# Patient Record
Sex: Female | Born: 1980 | Race: Black or African American | Hispanic: No | Marital: Single | Smoking: Former smoker
Health system: Southern US, Community
[De-identification: ages and names within clinical notes are randomized; demographics above are authoritative.]

## PROBLEM LIST (undated history)

## (undated) DIAGNOSIS — IMO0002 Reserved for concepts with insufficient information to code with codable children: Secondary | ICD-10-CM

## (undated) DIAGNOSIS — N946 Dysmenorrhea, unspecified: Secondary | ICD-10-CM

## (undated) DIAGNOSIS — J45909 Unspecified asthma, uncomplicated: Secondary | ICD-10-CM

## (undated) DIAGNOSIS — N920 Excessive and frequent menstruation with regular cycle: Secondary | ICD-10-CM

## (undated) DIAGNOSIS — I10 Essential (primary) hypertension: Secondary | ICD-10-CM

## (undated) DIAGNOSIS — D649 Anemia, unspecified: Secondary | ICD-10-CM

## (undated) DIAGNOSIS — K219 Gastro-esophageal reflux disease without esophagitis: Secondary | ICD-10-CM

## (undated) DIAGNOSIS — D219 Benign neoplasm of connective and other soft tissue, unspecified: Secondary | ICD-10-CM

## (undated) DIAGNOSIS — R87619 Unspecified abnormal cytological findings in specimens from cervix uteri: Secondary | ICD-10-CM

## (undated) DIAGNOSIS — R011 Cardiac murmur, unspecified: Secondary | ICD-10-CM

## (undated) DIAGNOSIS — B009 Herpesviral infection, unspecified: Secondary | ICD-10-CM

## (undated) DIAGNOSIS — F419 Anxiety disorder, unspecified: Secondary | ICD-10-CM

## (undated) HISTORY — DX: Excessive and frequent menstruation with regular cycle: N92.0

## (undated) HISTORY — DX: Unspecified abnormal cytological findings in specimens from cervix uteri: R87.619

## (undated) HISTORY — DX: Herpesviral infection, unspecified: B00.9

## (undated) HISTORY — DX: Unspecified asthma, uncomplicated: J45.909

## (undated) HISTORY — DX: Dysmenorrhea, unspecified: N94.6

## (undated) HISTORY — PX: NO PAST SURGERIES: SHX2092

## (undated) HISTORY — DX: Benign neoplasm of connective and other soft tissue, unspecified: D21.9

## (undated) HISTORY — DX: Anemia, unspecified: D64.9

## (undated) HISTORY — DX: Reserved for concepts with insufficient information to code with codable children: IMO0002

## (undated) HISTORY — PX: MYOMECTOMY: SHX85

---

## 2002-05-27 ENCOUNTER — Encounter: Admission: RE | Admit: 2002-05-27 | Discharge: 2002-05-27 | Payer: Self-pay | Admitting: Internal Medicine

## 2003-09-06 ENCOUNTER — Inpatient Hospital Stay (HOSPITAL_COMMUNITY): Admission: AD | Admit: 2003-09-06 | Discharge: 2003-09-06 | Payer: Self-pay | Admitting: *Deleted

## 2004-05-01 ENCOUNTER — Inpatient Hospital Stay (HOSPITAL_COMMUNITY): Admission: AD | Admit: 2004-05-01 | Discharge: 2004-05-03 | Payer: Self-pay

## 2004-08-03 ENCOUNTER — Ambulatory Visit: Payer: Self-pay | Admitting: Nurse Practitioner

## 2004-08-26 ENCOUNTER — Ambulatory Visit: Payer: Self-pay | Admitting: Internal Medicine

## 2004-08-26 ENCOUNTER — Ambulatory Visit: Payer: Self-pay | Admitting: *Deleted

## 2004-09-06 ENCOUNTER — Ambulatory Visit: Payer: Self-pay | Admitting: Nurse Practitioner

## 2004-09-21 ENCOUNTER — Ambulatory Visit: Payer: Self-pay | Admitting: Nurse Practitioner

## 2005-01-11 ENCOUNTER — Ambulatory Visit: Payer: Self-pay | Admitting: Nurse Practitioner

## 2005-04-25 ENCOUNTER — Ambulatory Visit: Payer: Self-pay | Admitting: Nurse Practitioner

## 2005-06-16 ENCOUNTER — Ambulatory Visit: Payer: Self-pay | Admitting: Nurse Practitioner

## 2005-07-25 ENCOUNTER — Ambulatory Visit: Payer: Self-pay | Admitting: Nurse Practitioner

## 2005-08-16 ENCOUNTER — Ambulatory Visit: Payer: Self-pay | Admitting: Nurse Practitioner

## 2005-10-24 ENCOUNTER — Ambulatory Visit: Payer: Self-pay | Admitting: Nurse Practitioner

## 2006-01-19 ENCOUNTER — Ambulatory Visit: Payer: Self-pay | Admitting: Nurse Practitioner

## 2006-01-23 ENCOUNTER — Ambulatory Visit (HOSPITAL_COMMUNITY): Admission: RE | Admit: 2006-01-23 | Discharge: 2006-01-23 | Payer: Self-pay | Admitting: Internal Medicine

## 2006-01-29 ENCOUNTER — Ambulatory Visit: Payer: Self-pay | Admitting: Nurse Practitioner

## 2006-01-31 ENCOUNTER — Ambulatory Visit: Payer: Self-pay | Admitting: Nurse Practitioner

## 2006-05-18 ENCOUNTER — Ambulatory Visit: Payer: Self-pay | Admitting: Family Medicine

## 2006-06-11 ENCOUNTER — Ambulatory Visit: Payer: Self-pay | Admitting: Nurse Practitioner

## 2006-06-20 ENCOUNTER — Ambulatory Visit: Payer: Self-pay | Admitting: Nurse Practitioner

## 2006-08-08 ENCOUNTER — Ambulatory Visit: Payer: Self-pay | Admitting: Nurse Practitioner

## 2007-01-28 ENCOUNTER — Emergency Department (HOSPITAL_COMMUNITY): Admission: EM | Admit: 2007-01-28 | Discharge: 2007-01-28 | Payer: Self-pay | Admitting: Family Medicine

## 2007-02-01 ENCOUNTER — Emergency Department (HOSPITAL_COMMUNITY): Admission: EM | Admit: 2007-02-01 | Discharge: 2007-02-01 | Payer: Self-pay | Admitting: Family Medicine

## 2007-02-03 ENCOUNTER — Emergency Department (HOSPITAL_COMMUNITY): Admission: EM | Admit: 2007-02-03 | Discharge: 2007-02-03 | Payer: Self-pay | Admitting: Family Medicine

## 2007-02-25 DIAGNOSIS — J45909 Unspecified asthma, uncomplicated: Secondary | ICD-10-CM | POA: Insufficient documentation

## 2007-05-08 ENCOUNTER — Encounter (INDEPENDENT_AMBULATORY_CARE_PROVIDER_SITE_OTHER): Payer: Self-pay | Admitting: *Deleted

## 2008-05-26 ENCOUNTER — Encounter: Admission: RE | Admit: 2008-05-26 | Discharge: 2008-05-26 | Payer: Self-pay | Admitting: Internal Medicine

## 2008-11-22 ENCOUNTER — Emergency Department (HOSPITAL_COMMUNITY): Admission: EM | Admit: 2008-11-22 | Discharge: 2008-11-22 | Payer: Self-pay | Admitting: Emergency Medicine

## 2010-02-17 IMAGING — US US TRANSVAGINAL NON-OB
1 series · 13 of 25 positions shown · non-contrast
Comparison: [HOSPITAL] early pregnancy pelvic ultrasound
 09/06/2003 and [REDACTED] abdominal pelvic CT urogram 02/01/2007.

May 29, 2008 –DUPLICATE COPY for exam association in [facility]. No change from original report.
CLINICAL DATA: Follow-up left ovarian cyst. Pelvic pain.

 TRANSABDOMINAL AND TRANSVAGINAL ULTRASOUND OF PELVIS
TECHNIQUE: Both transabdominal and transvaginal ultrasound
 examinations of the pelvis were performed including evaluation of
 the uterus, ovaries, adnexal regions, and pelvic cul-de-sac.

[Series 1: us transvaginal non-ob · 0.24mm/px · 13 of 66 slices shown]
[im 1/66]
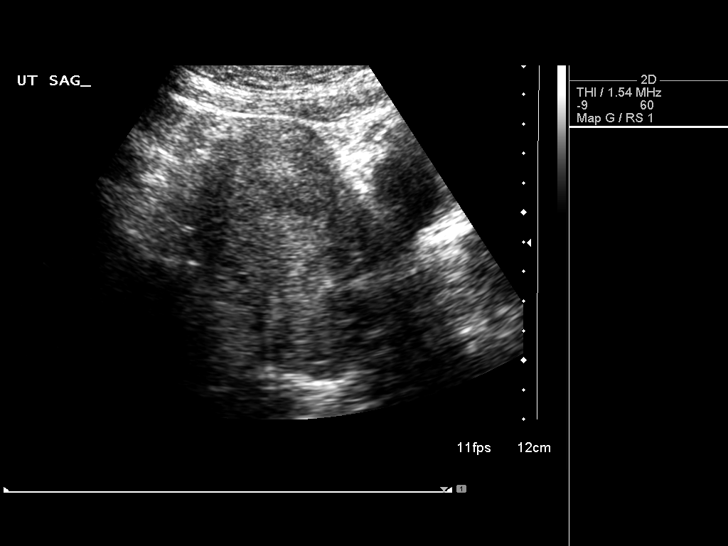
[im 6/66]
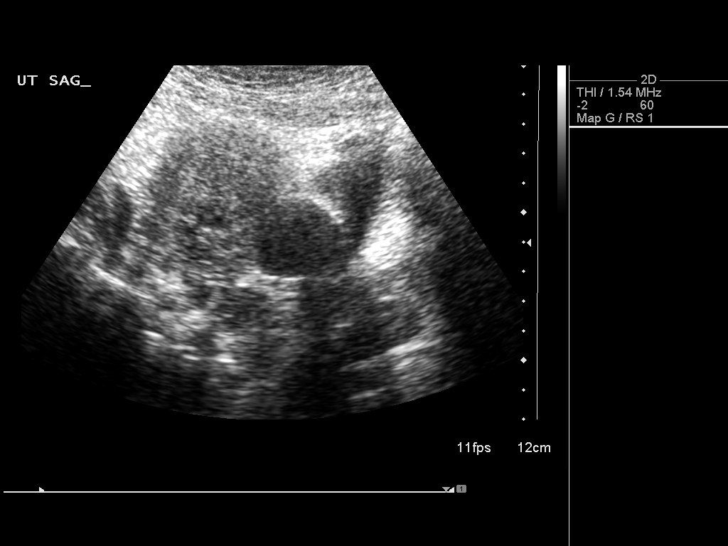
[im 11/66]
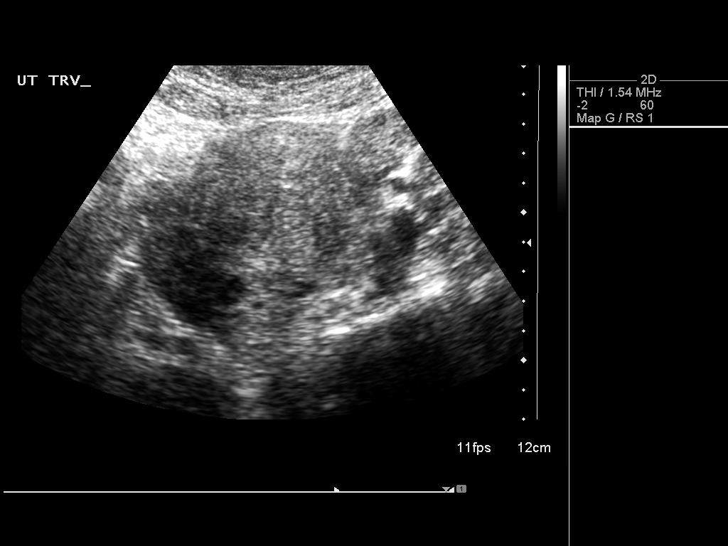
[im 17/66]
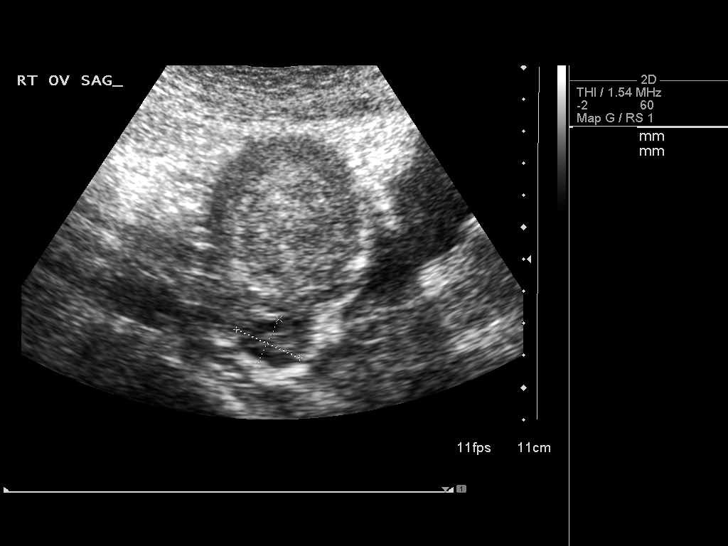
[im 22/66]
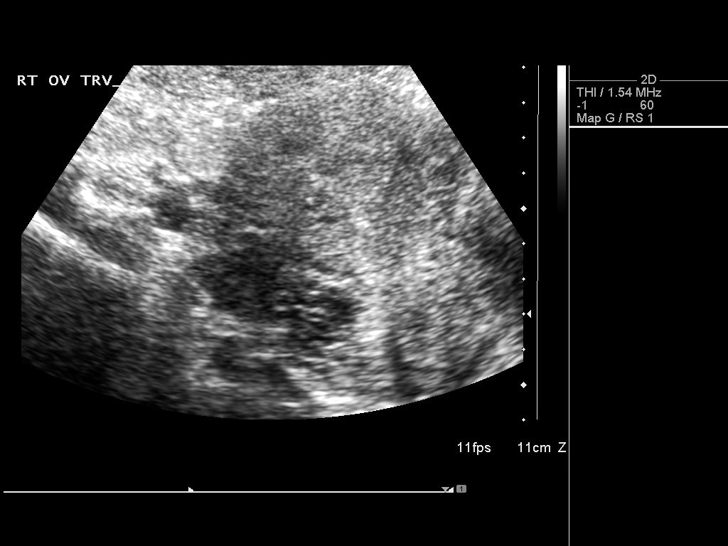
[im 28/66]
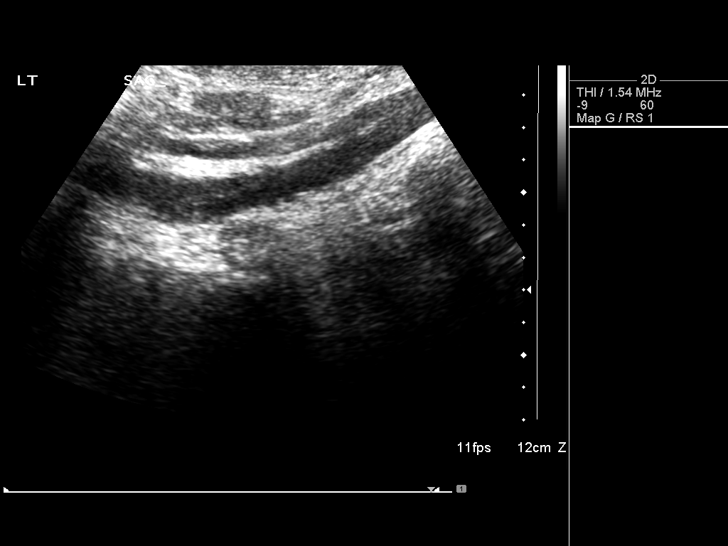
[im 33/66]
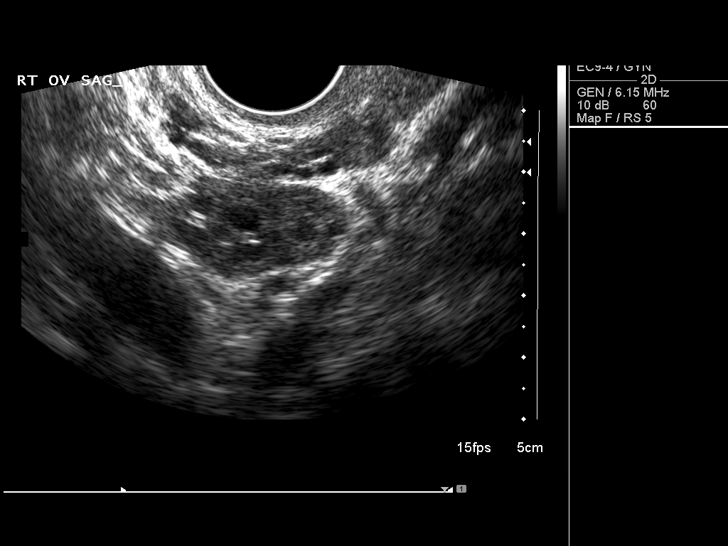
[im 38/66]
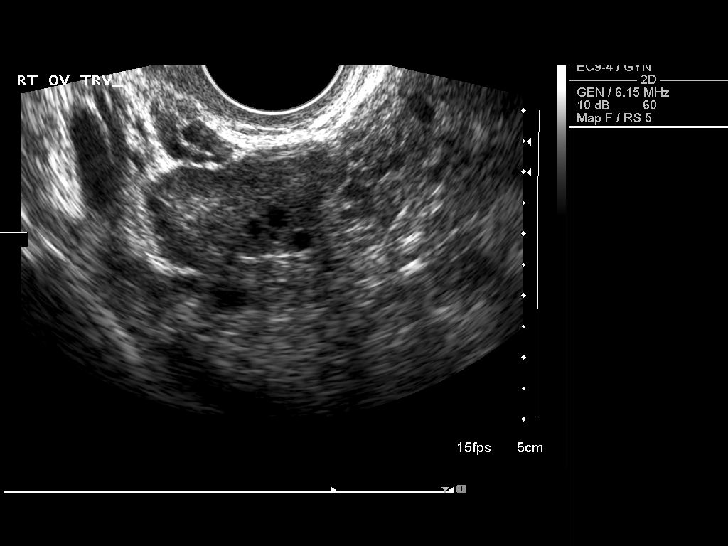
[im 44/66]
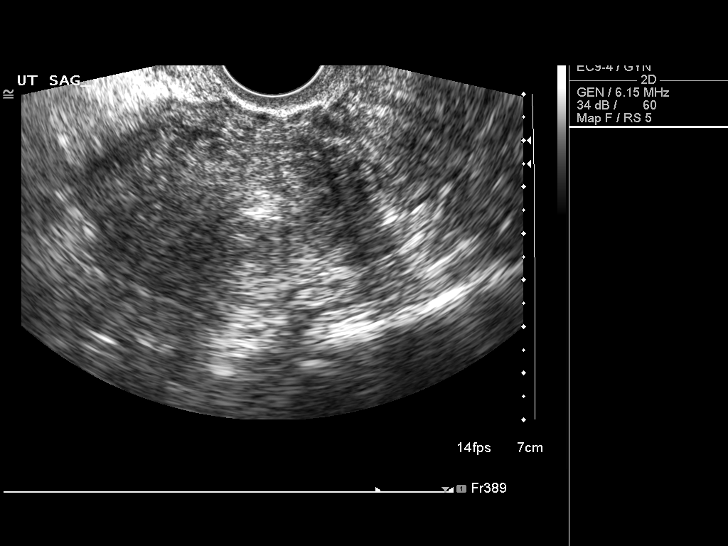
[im 49/66]
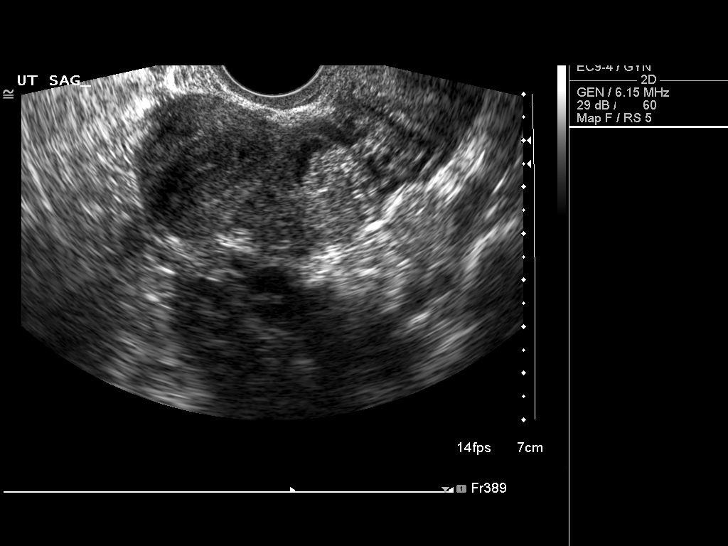
[im 55/66]
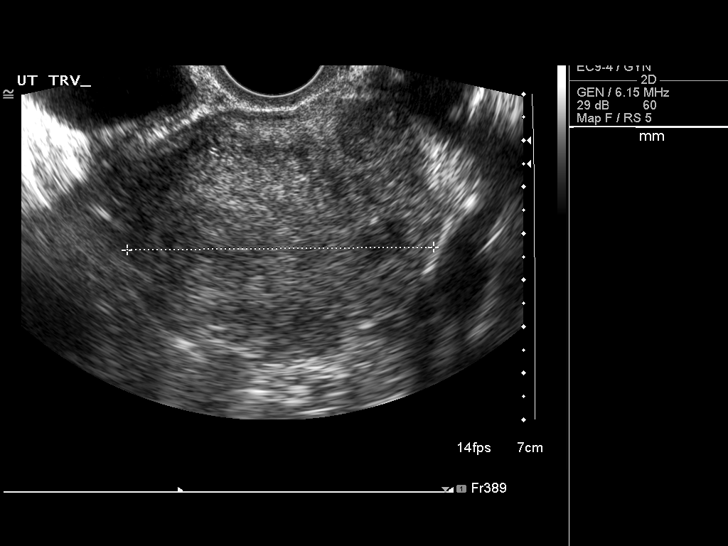
[im 60/66]
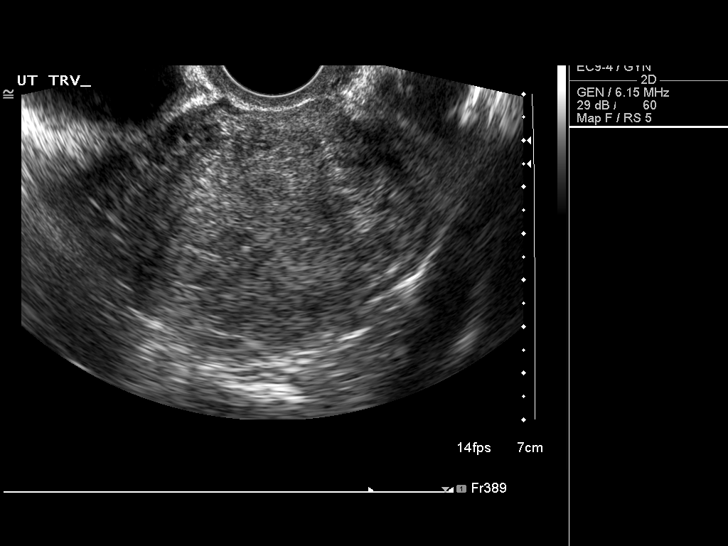
[im 66/66]
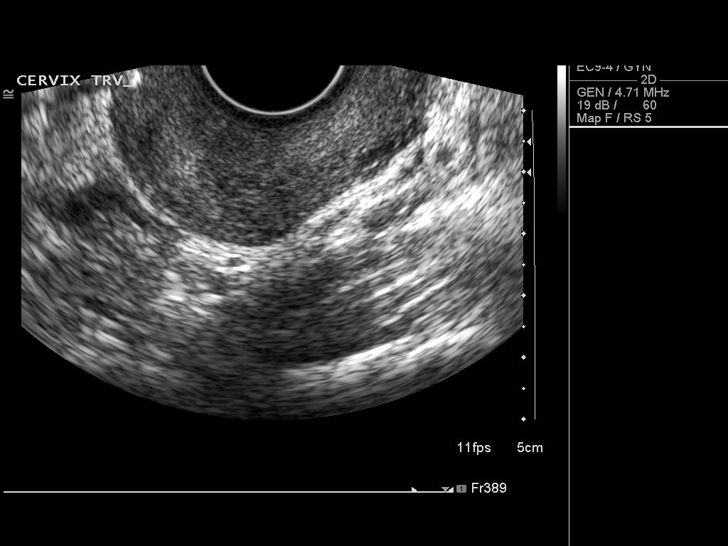

[13 of 25 positions shown; findings below may reference images not displayed]

FINDINGS: Uterus is normal in size measuring 8.8 cm long X 4.9 cm
 AP X 6.5 cm wide. Fundal endometrial stripe thickness is normal at
 9 mm. At the left anterolateral fundus is exophytic fibroid
 measuring 2.8 cm long X 3.2 cm AP X 2.6 cm wide. Myometrium is
 slightly heterogeneous with stable punctate calcification at the
 fundus with no additional focal uterine lesions identified. Slight
 cul-de-sac free fluid is seen transvaginally only consistent with
 likely physiologic ovulation. Right ovary appears sonographically
 normal measuring 3.1 cm long X 2.3 cm AP X 1.6 cm wide. Left ovary
 is not identified.
IMPRESSION: 1. Left ovary not identified with left anterolateral fundal 3.2 cm
 fibroid. Previous [REDACTED] pelvic CT 02/01/2007 [DATE] have represented
 exophytic centrally degenerated fibroid or ovary with aid of
 current study and retrospection. Consider pelvic MRI for further
 evaluation as clinically indicated.
 2. Diffuse myometrial heterogeneity consistent with adenomyosis
 and/or diffuse fibroid changes.
 3. Probable physiologic ovulatory cul-de-sac free fluid seen
 transvaginally only.
 4. Otherwise no significant abnormality.

## 2011-01-06 NOTE — H&P (Signed)
Kim Welch, Kim Welch                        ACCOUNT NO.:  [id number]   MEDICAL RECORD NO.:  [id number]                   PATIENT TYPE:  INP   LOCATION:  9130                                 FACILITY:  [facility]   PHYSICIAN:  Naima A. Dillard, M.D.              DATE OF BIRTH:  20-Jul-1981   DATE OF ADMISSION:  05/01/2004  DATE OF DISCHARGE:                                HISTORY & PHYSICAL   HISTORY OF PRESENT ILLNESS:  Kim Welch is a 30 year old gravida 2 para 0-0-  1-0 at 39-4/7 weeks, EDD May 04, 2004 by dates.  She presents in early  active labor.  She has been contracting throughout the night and these  contractions are now 2-3 minutes apart and have increased in their  intensity.  She reports positive fetal movement, no bleeding, no rupture of  membranes, she denies any PIH symptoms, no headache, visual changes or  epigastric pain.  Her pregnancy has been followed by the CNM service at [facility]  and is remarkable for (1) first trimester spotting, (2) asthma, (3)  fibroids, (4) nausea and vomiting with weight loss in the first trimester,  (5) PENICILLIN allergy, and (6) group B strep negative.  This patient was  initially evaluated at the office of [facility] on November 05, 2003 at [redacted] weeks  gestation.  Patient transferred care from [facility].  Her pregnancy has  been essentially unremarkable.  She did have a 10-pound weight loss in the  first trimester however, she then began to put on weight with minimal nausea  and vomiting after 14 weeks, by 20 weeks nausea and vomiting had completely  ceased and she  had started to steadily gain weight.  She has been  normotensive throughout her pregnancy with no proteinuria, at 34 weeks  ultrasound for growth secondary to low weight gain at the beginning of  pregnancy found growth at the 23rd to 28th percentile, AFI within normal  limits, she did have some enlargement of her fibroid tumor from 5.4 cm to  8.7 cm.  Baby was breech at that point at  34 weeks, her next appointment at  35 weeks was with Dr. Stefano Gaul to discuss C-section, baby then spontaneously  reverted to vertex and has remained in the vertex position since that time.   PRENATAL LABORATORIES:  Prenatal lab work on November 05, 2003 hemoglobin and  hematocrit 12.5 and 36.6, platelets 263,000, blood type and Rh O positive,  antibody screen negative, VDRL nonreactive, rubella immune, hepatitis B  surface antigen negative, HIV negative, sickle cell trait negative, Pap  smear within normal limits, GC and Chlamydia negative, quad screen within  normal limits, at 28 weeks 1-hour glucose challenge within normal and  hemoglobin RPR was negative at that time, at 36 weeks culture of the vaginal  tract is negative for group B strep and negative for GC and Chlamydia.   OBSTETRICAL HISTORY:  In 2004 patient had  a first trimester elective AB with  no complications in the present pregnancy.   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  She has also been noted with this pregnancy to have  fibroid tumors of the uterus, these have presented no complications with her  pregnancy or delivery.   FAMILY HISTORY:  Mother with chronic hypertension.   GENETIC HISTORY:  Genetic history unremarkable.  There is no family history  of familial or genetic disorders, babies that were born with birth defects  and no babies that died in infancy.   SOCIAL HISTORY:  Kim Welch is a single 30 year old African-American female.  She works as a Chartered loss adjuster.  The father of the baby is Kim Welch  __________ .  They are [facility] in their faith.   REVIEW OF SYSTEMS:  Review of systems is as described above.  Patient is  typical of one with a uterine pregnancy at term in early active labor.   PHYSICAL EXAMINATION:  VITAL SIGNS:  Stable, afebrile.  HEENT:  Unremarkable.  HEART:  Regular rate and rhythm.  LUNGS:  Clear.  ABDOMEN:  Abdomen is gravid in  its contour, uterine fundus is noted to  extend 40 cm above the level of the pubic symphysis, Leopold's maneuvers  find the infant to be in a longitudinal lie, cephalic presentation and the  estimated fetal weight is 7 to 7-1/2 pounds.  CERVIX:  Digital exam of the cervix finds it to be 3-4 cm dilated, 90%  effaced, with the cephalic presenting part at a -2 station, membranes are  intact.  EXTREMITIES:  No pathologic edema.  DTR's are 1+ with no clonus.   ASSESSMENT:  Intrauterine pregnancy at term, early active labor.   PLAN:  Admit per Dr. Jaymes Graff.  Routine CNM orders.  Patient may have  Stadol and Phenergan for pain per her request.  Anticipate continued labor  progress with spontaneous vaginal delivery.  This has been discussed with  the patient in detail in language she can understand and she has indicated  her agreement.     Rica Koyanagi, C.N.M.               Naima A. Normand Sloop, M.D.    SDM/MEDQ  D:  05/01/2004  T:  05/01/2004  Job:  [id number]

## 2011-04-20 ENCOUNTER — Other Ambulatory Visit: Payer: Self-pay | Admitting: Obstetrics and Gynecology

## 2011-04-20 ENCOUNTER — Ambulatory Visit
Admission: RE | Admit: 2011-04-20 | Discharge: 2011-04-20 | Disposition: A | Discharge status: Home / Self Care | Payer: BC Managed Care – PPO | Source: Ambulatory Visit | Attending: Obstetrics and Gynecology | Admitting: Obstetrics and Gynecology

## 2011-06-07 LAB — SEDIMENTATION RATE: Sed Rate: 24 — ABNORMAL HIGH

## 2011-06-07 LAB — POCT I-STAT CREATININE
Creatinine, Ser: 1.2
Operator id: 235561

## 2011-06-07 LAB — I-STAT 8, (EC8 V) (CONVERTED LAB)
Acid-Base Excess: 4 — ABNORMAL HIGH
BUN: 9
Bicarbonate: 28.9 — ABNORMAL HIGH
Chloride: 98
Glucose, Bld: 138 — ABNORMAL HIGH
HCT: 45
Hemoglobin: 15.3 — ABNORMAL HIGH
Operator id: 235561
Potassium: 3.3 — ABNORMAL LOW
Sodium: 134 — ABNORMAL LOW
TCO2: 30
pCO2, Ven: 43.7 — ABNORMAL LOW
pH, Ven: 7.429 — ABNORMAL HIGH

## 2011-06-07 LAB — ANTISTREPTOLYSIN O TITER: ASO: 51 (ref 0–116)

## 2011-06-07 LAB — POCT RAPID STREP A: Streptococcus, Group A Screen (Direct): NEGATIVE

## 2011-06-07 LAB — POCT INFECTIOUS MONO SCREEN: Mono Screen: NEGATIVE

## 2011-06-08 LAB — POCT URINALYSIS DIP (DEVICE)
Bilirubin Urine: NEGATIVE
Bilirubin Urine: NEGATIVE
Glucose, UA: NEGATIVE
Glucose, UA: NEGATIVE
Ketones, ur: 40 — AB
Ketones, ur: NEGATIVE
Nitrite: NEGATIVE
Nitrite: NEGATIVE
Operator id: 116391
Operator id: 235561
Protein, ur: 100 — AB
Specific Gravity, Urine: 1.01
Specific Gravity, Urine: >=1.03
Urobilinogen, UA: 0.2
Urobilinogen, UA: 0.2
pH: 5.5
pH: 6.5

## 2011-06-08 LAB — I-STAT 8, (EC8 V) (CONVERTED LAB)
Acid-Base Excess: 2
BUN: 15
Bicarbonate: 27.3 — ABNORMAL HIGH
Chloride: 100
Glucose, Bld: 108 — ABNORMAL HIGH
HCT: 46
Hemoglobin: 15.6 — ABNORMAL HIGH
Operator id: 116391
Potassium: 3.7
Sodium: 134 — ABNORMAL LOW
TCO2: 29
pCO2, Ven: 44.7 — ABNORMAL LOW
pH, Ven: 7.393 — ABNORMAL HIGH

## 2011-06-08 LAB — URINE CULTURE
Colony Count: NO GROWTH
Culture: NO GROWTH

## 2011-06-08 LAB — GC/CHLAMYDIA PROBE AMP, GENITAL
Chlamydia, DNA Probe: NEGATIVE
GC Probe Amp, Genital: NEGATIVE

## 2011-06-08 LAB — DIFFERENTIAL
Basophils Absolute: 0
Basophils Relative: 1
Eosinophils Absolute: 0.1
Eosinophils Relative: 1
Lymphocytes Relative: 8 — ABNORMAL LOW
Lymphs Abs: 0.6 — ABNORMAL LOW
Monocytes Absolute: 1.1 — ABNORMAL HIGH
Monocytes Relative: 14 — ABNORMAL HIGH
Neutro Abs: 6.2
Neutrophils Relative %: 78 — ABNORMAL HIGH

## 2011-06-08 LAB — CBC
HCT: 39.3
Hemoglobin: 12.9
MCHC: 32.7
MCV: 77.8 — ABNORMAL LOW
Platelets: 230
RBC: 5.05
RDW: 13.5
WBC: 7.9

## 2011-06-08 LAB — POCT I-STAT CREATININE
Creatinine, Ser: 2 — ABNORMAL HIGH
Operator id: 116391

## 2011-06-08 LAB — POCT PREGNANCY, URINE
Operator id: 235561
Preg Test, Ur: NEGATIVE

## 2011-06-08 LAB — WET PREP, GENITAL
Trich, Wet Prep: NONE SEEN
Yeast Wet Prep HPF POC: NONE SEEN

## 2011-12-22 ENCOUNTER — Ambulatory Visit (INDEPENDENT_AMBULATORY_CARE_PROVIDER_SITE_OTHER): Payer: BC Managed Care – PPO | Admitting: Obstetrics and Gynecology

## 2011-12-22 ENCOUNTER — Encounter: Payer: Self-pay | Admitting: Obstetrics and Gynecology

## 2011-12-22 VITALS — BP 122/70 | HR 80 | Ht 63.25 in | Wt 176.0 lb

## 2011-12-22 DIAGNOSIS — D219 Benign neoplasm of connective and other soft tissue, unspecified: Secondary | ICD-10-CM | POA: Insufficient documentation

## 2011-12-22 DIAGNOSIS — Z309 Encounter for contraceptive management, unspecified: Secondary | ICD-10-CM

## 2011-12-22 DIAGNOSIS — Z3046 Encounter for surveillance of implantable subdermal contraceptive: Secondary | ICD-10-CM

## 2011-12-22 DIAGNOSIS — IMO0001 Reserved for inherently not codable concepts without codable children: Secondary | ICD-10-CM

## 2011-12-22 DIAGNOSIS — N926 Irregular menstruation, unspecified: Secondary | ICD-10-CM | POA: Insufficient documentation

## 2011-12-22 DIAGNOSIS — Z1329 Encounter for screening for other suspected endocrine disorder: Secondary | ICD-10-CM

## 2011-12-22 DIAGNOSIS — N920 Excessive and frequent menstruation with regular cycle: Secondary | ICD-10-CM

## 2011-12-22 DIAGNOSIS — Z1321 Encounter for screening for nutritional disorder: Secondary | ICD-10-CM

## 2011-12-22 DIAGNOSIS — D259 Leiomyoma of uterus, unspecified: Secondary | ICD-10-CM

## 2011-12-22 DIAGNOSIS — D649 Anemia, unspecified: Secondary | ICD-10-CM

## 2011-12-22 MED ORDER — ETONOGESTREL-ETHINYL ESTRADIOL 0.12-0.015 MG/24HR VA RING: VAGINAL_RING | VAGINAL | Status: DC

## 2011-12-22 NOTE — Patient Instructions (Addendum)
Schedule Mirena IUD insertion (have [name] check for insurance coverage) Previous device was spontaneously expelled.Call [facility] @ [phone] if:  You have a temperature greater than or equal to 100.4 degrees Farenheit orally You have pain that is not made better by the pain medication given and taken as directed You have excessive bleeding   Keep your left arm clean and dry for 24 hours and incision site covered for 48 hours  Return in 1 week to incision check

## 2011-12-22 NOTE — Progress Notes (Signed)
Patient with Nexplanon-requesting removal and wants Mirena. if insurance will cover. Wants to use Nuvaring in the interim.  Requesting that her iron be checked due to irregular bleeding with the Nexplanon.  Spent 25 minutes discussing MOA, side effects, risks & benefits, dosing/use of all contraceptives with emphasis on Mirena  &  Nuvaring.  O: Nexplanon removed  from medial left upper arm without difficulty per protocol. Patient tolerated procedure well. Skin closed with steri-strips and Benzoin, dressed with sterile band aids, 4 x 4 plain gauze and Kling pressure bandage.  A; Nexplanon removal     Menorrhagia     H/O IUD Expulsion        P: explore insurance coverage for Mirena (previous one spontaneously expelled)  Nuvaring samples #3 1 pv vor 21 of 28 days  Reviewed signs & symptoms of infection and wound care  CBD & Vitamin D 25-H-pending  Nguyet Mercer J. Lowell Guitar, PA-C

## 2011-12-23 LAB — CBC
HCT: 32.5 % — ABNORMAL LOW (ref 36.0–46.0)
Hemoglobin: 9.7 g/dL — ABNORMAL LOW (ref 12.0–15.0)
MCHC: 29.8 g/dL — ABNORMAL LOW (ref 30.0–36.0)
MCV: 76.7 fL — ABNORMAL LOW (ref 78.0–100.0)
WBC: 6 10*3/uL (ref 4.0–10.5)

## 2011-12-23 LAB — VITAMIN D 25 HYDROXY (VIT D DEFICIENCY, FRACTURES): Vit D, 25-Hydroxy: 13 ng/mL — ABNORMAL LOW (ref 30–89)

## 2011-12-25 ENCOUNTER — Telehealth: Payer: Self-pay

## 2011-12-25 NOTE — Telephone Encounter (Signed)
TC TO PT REGARDING VIT D. INFORMED PT THAT VIT D  LEVEL IS LOW AND I NEED TO CALL IN RX FOR PT. WENT OVER VIT D PROTOCOL WITH PT AND WILL CALL  VIT D 50,000 units 1 cap. 2x weekly for 8 weeks #28 with o rf to pt pharmacy. ALSO INFORMED PT THAT SHE NEED TO TAKE IRON 2X WEEK WITH A STOOL SOFTNER TO GET HER IRON LEVEL UP. PT VOICED UNDERSTANDING. TOLD PT THAT TSH WAS NL.

## 2012-01-02 ENCOUNTER — Telehealth: Payer: Self-pay | Admitting: Obstetrics and Gynecology

## 2012-01-02 NOTE — Telephone Encounter (Signed)
Linda/ep pt. °

## 2012-03-12 ENCOUNTER — Other Ambulatory Visit: Payer: Self-pay | Admitting: Obstetrics and Gynecology

## 2012-03-12 NOTE — Telephone Encounter (Signed)
Spoke with pt rgd nuvaring samples. Pt stated she needs samples on rx until her aex. Offered pt 2 samples of nuvaring . Pt will come and pick them up tomorrow around 4 pm . Pt's voice understanding .

## 2012-04-19 ENCOUNTER — Encounter: Payer: Self-pay | Admitting: Obstetrics and Gynecology

## 2012-04-19 ENCOUNTER — Ambulatory Visit (INDEPENDENT_AMBULATORY_CARE_PROVIDER_SITE_OTHER): Payer: BC Managed Care – PPO | Admitting: Obstetrics and Gynecology

## 2012-04-19 VITALS — BP 122/70 | HR 76 | Ht 63.25 in | Wt 179.0 lb

## 2012-04-19 DIAGNOSIS — Z01419 Encounter for gynecological examination (general) (routine) without abnormal findings: Secondary | ICD-10-CM

## 2012-04-19 DIAGNOSIS — Z124 Encounter for screening for malignant neoplasm of cervix: Secondary | ICD-10-CM

## 2012-04-19 DIAGNOSIS — Z113 Encounter for screening for infections with a predominantly sexual mode of transmission: Secondary | ICD-10-CM

## 2012-04-19 NOTE — Progress Notes (Signed)
Regular Periods: yes Mammogram: no  Monthly Breast Ex.: yes Exercise: no  Tetanus < 10 years: yes Seatbelts: yes  NI. Bladder Functn.: yes Abuse at home: no  Daily BM's: no Stressful Work: no  Healthy Diet: yes Sigmoid-Colonoscopy: NO  Calcium: no Medical problems this year: WANT IUD AGAIN   LAST PAP:2012 NL  Contraception: NUVA RING  Mammogram:  NO  PCP: DR. Grayland Jack  PMH:  NO CHANGE  FMH: NO CHANGE  Last Bone Scan: NO   PT IS SINGLE

## 2012-04-19 NOTE — Progress Notes (Signed)
Subjective:    Kim Welch is a 31 y.o. female, G2P1001, who presents for an annual exam. The patient requests STD testing.  Patient wants to get the Mirena IUD.  Menstrual cycle:   LMP: Patient's last menstrual period was 03/18/2012.             Review of Systems Pertinent items are noted in HPI. Denies pelvic pain, urinary tract symptoms, vaginitis symptoms, irregular bleeding, menopausal symptoms, change in bowel habits or rectal bleeding   Objective:    BP 122/70  Pulse 76  Ht 5' 3.25" (1.607 m)  Wt 179 lb (81.194 kg)  BMI 31.46 kg/m2  LMP 03/18/2012    Wt Readings from Last 1 Encounters:  04/19/12 179 lb (81.194 kg)   Body mass index is 31.46 kg/(m^2). General Appearance: Alert, no acute distress HEENT: Grossly normal Neck / Thyroid: Supple, no thyromegaly or cervical adenopathy Lungs: Clear to auscultation bilaterally Back: No CVA tenderness Breast Exam: No masses or nodes.No dimpling, nipple retraction or discharge. Cardiovascular: Regular rate and rhythm.  Gastrointestinal: Soft, non-tender, no masses or organomegaly Pelvic Exam: EGBUS-wnl, vagina-normal rugae, cervix- without lesions or tenderness, uterus appears normal size shape and consistency, adnexae-no masses or tenderness Lymphatic Exam: Non-palpable nodes in neck, clavicular,  axillary, or inguinal regions  Skin: no rashes or abnormalities Extremities: no clubbing cyanosis or edema  Neurologic: grossly normal Psychiatric: Alert and oriented   Assessment:   Routine GYN Exam Menorrhagia   Plan:  Schedule Mirena insertion (patient had one previously that came out)  Continue Nuva Ring until IUD is inserted  PAP sent  STD testing  IUD Information Sheet given  RTO 1 year or prn  Yvana Samonte,ELMIRAPA-C

## 2012-04-19 NOTE — Patient Instructions (Signed)
Download a discount coupon for [employer] on the internet @  [email or web address]

## 2012-04-25 LAB — PAP IG, CT-NG, RFX HPV ASCU

## 2012-05-07 ENCOUNTER — Telehealth: Payer: Self-pay | Admitting: Obstetrics and Gynecology

## 2012-05-07 NOTE — Telephone Encounter (Signed)
Message copied by Mason Jim on Tue May 07, 2012 10:35 AM ------      Message from: Henreitta Leber      Created: Tue May 07, 2012  7:23 AM       Patient needs to be scheduled for a colposcopy with the first available physician. Chart to triage for scheduling.  POWELL,ELMIRA, PA-C

## 2012-05-07 NOTE — Telephone Encounter (Signed)
TC to pt. LM to return call.  

## 2012-05-07 NOTE — Telephone Encounter (Signed)
Message copied by Mason Jim on Tue May 07, 2012  3:57 PM ------      Message from: Henreitta Leber      Created: Tue May 07, 2012  7:23 AM       Patient needs to be scheduled for a colposcopy with the first available physician. Chart to triage for scheduling.  POWELL,ELMIRA, PA-C

## 2012-05-07 NOTE — Progress Notes (Signed)
Quick Note:  Patient needs to be scheduled for a colposcopy with the first available physician. Chart to triage for scheduling. Supriya Beaston, PA-C ______

## 2012-05-07 NOTE — Telephone Encounter (Signed)
Returned pt's call. Informed of Pap result. Pt states Dr AVS did previous colpo.  Sched with Dr AVS 05/29/12.  Instructions given.

## 2012-05-14 ENCOUNTER — Telehealth: Payer: Self-pay | Admitting: Obstetrics and Gynecology

## 2012-05-15 ENCOUNTER — Telehealth: Payer: Self-pay | Admitting: Obstetrics and Gynecology

## 2012-05-15 ENCOUNTER — Telehealth: Payer: Self-pay

## 2012-05-15 NOTE — Telephone Encounter (Signed)
Lm for pt to call back

## 2012-05-15 NOTE — Telephone Encounter (Signed)
Lm on vm tcb rgd msg per EP Patient should still be on her Nuvaring until her Mirena is inserted in which case, there is not anything I can give her. If this is not the case then she may take Provera 10 mg daily the week of her period and she should be sure to abstain from intercourse or use condoms each time she has intercourse.

## 2012-05-15 NOTE — Telephone Encounter (Signed)
ELMIRA THIS IS THE PT THAT KEISHA WAS ASKING YOU ABOUT.

## 2012-05-16 MED ORDER — MEDROXYPROGESTERONE ACETATE 10 MG PO TABS: 10.0000 mg | ORAL_TABLET | Freq: Every day | ORAL | Status: DC

## 2012-05-16 NOTE — Telephone Encounter (Signed)
Spoke with pt rgd msg informed infor below per ep informed rx sent to pharm pt voice understanding

## 2012-05-22 ENCOUNTER — Telehealth: Payer: Self-pay | Admitting: Obstetrics and Gynecology

## 2012-05-22 NOTE — Telephone Encounter (Signed)
Spoke with EP rgd msg. Ep approved rx to be called into pharmacy provera 10 mg 1 po every 6 hours until the bleeding stops then 1po daily for 14 days disp 30 with 1 refill pt. Aware of rx and voice understanding . Pt is going to cancel app for tomorrow for her IUD insertion and her colpo for next week . Pt's voice understanding BT CMA

## 2012-05-22 NOTE — Telephone Encounter (Signed)
Spoke with pt rgd msg. Pt wanted me to talk with Ep to see if pt would be able to take the pill. Advised pt that would consult with EP in the morning and me or the nurse in triage would call pt back. Pt also need to reschedule her colpo app . Pt's voice understanding. bt cma

## 2012-05-22 NOTE — Telephone Encounter (Signed)
Spoke with pt rgd msg. Pt stated that she is suppose to have her iud insertion on 05/23/2012 and suppose to have her colpo next week.pt stated still having a very heavy cycle. Ep gave her provera 10 mg and still bleeding. Advised pt would consult with EP and would call pt back. Pt's voice understanding.

## 2012-05-23 ENCOUNTER — Encounter: Payer: BC Managed Care – PPO | Admitting: Obstetrics and Gynecology

## 2012-05-29 ENCOUNTER — Encounter: Payer: BC Managed Care – PPO | Admitting: Obstetrics and Gynecology

## 2012-06-05 ENCOUNTER — Telehealth: Payer: Self-pay | Admitting: Obstetrics and Gynecology

## 2012-06-05 NOTE — Telephone Encounter (Signed)
Lm on vm tcb rgd msg 

## 2012-06-06 NOTE — Telephone Encounter (Signed)
Lm on vm tcb rgd msg 

## 2012-06-06 NOTE — Telephone Encounter (Signed)
Spoke with pt rgd msg pt wants to r/s colpo pt has appt 05/2412 at 2;00 with avs pt voice understanding

## 2012-06-13 ENCOUNTER — Encounter: Payer: Self-pay | Admitting: Obstetrics and Gynecology

## 2012-06-13 ENCOUNTER — Ambulatory Visit (INDEPENDENT_AMBULATORY_CARE_PROVIDER_SITE_OTHER): Payer: BC Managed Care – PPO | Admitting: Obstetrics and Gynecology

## 2012-06-13 VITALS — BP 132/82 | Resp 16 | Wt 184.0 lb

## 2012-06-13 DIAGNOSIS — B977 Papillomavirus as the cause of diseases classified elsewhere: Secondary | ICD-10-CM

## 2012-06-13 DIAGNOSIS — E559 Vitamin D deficiency, unspecified: Secondary | ICD-10-CM

## 2012-06-13 DIAGNOSIS — D259 Leiomyoma of uterus, unspecified: Secondary | ICD-10-CM

## 2012-06-13 DIAGNOSIS — R6889 Other general symptoms and signs: Secondary | ICD-10-CM

## 2012-06-13 DIAGNOSIS — N898 Other specified noninflammatory disorders of vagina: Secondary | ICD-10-CM

## 2012-06-13 DIAGNOSIS — R5383 Other fatigue: Secondary | ICD-10-CM

## 2012-06-13 DIAGNOSIS — D219 Benign neoplasm of connective and other soft tissue, unspecified: Secondary | ICD-10-CM

## 2012-06-13 DIAGNOSIS — N926 Irregular menstruation, unspecified: Secondary | ICD-10-CM

## 2012-06-13 DIAGNOSIS — IMO0002 Reserved for concepts with insufficient information to code with codable children: Secondary | ICD-10-CM

## 2012-06-13 LAB — POCT URINE PREGNANCY: Preg Test, Ur: NEGATIVE

## 2012-06-13 MED ORDER — NORETHINDRONE-ETH ESTRADIOL 1-35 MG-MCG PO TABS: 1.0000 | ORAL_TABLET | Freq: Every day | ORAL | Status: DC

## 2012-06-13 NOTE — Addendum Note (Signed)
Addended by: Janine Limbo on: 06/13/2012 03:18 PM   Modules accepted: Orders

## 2012-06-13 NOTE — Progress Notes (Signed)
HISTORY OF PRESENT ILLNESS  [name] is a 31 y.o. year old female,G2P1001, who presents for a problem visit. The patient has a history of HPV and CIN-1.  Her Pap smear returned again showing ascus and HPV. Gonorrhea, Chlamydia, RPR, and HIV were all negative at her last visit.  Patient has a history of anemia and low vitamin D. The patient has a history of fibroids. Subjective:  The patient reports that Nuvaring cause her periods to be irregular.  She wants to try birth control pills.  She denies contraindications.  She complains of a vaginal discharge. She complains of fatigue.  She wants to check her CBC and vitamin D level.  Objective:  BP 132/82  Resp 16  Wt 184 lb (83.462 kg)  LMP 05/19/2012   General: no distress Resp: clear to auscultation bilaterally Cardio: regular rate and rhythm, S1, S2 normal, no murmur, click, rub or gallop GI: soft and nontender  External genitalia: normal general appearance Vaginal: normal without tenderness, induration or masses Cervix: see colposcopy note Adnexa: normal bimanual exam Uterus: 8 week size, irregular  COLPOSCOPY NOTE:  The colposcopy procedure was explained.  The patient's questions were answered. A speculum exam was performed.  The cervix was prepped with acetic acid and Hurricaine gel.  The cervix was evaluated using a white light and the green filter. Findings: white epithelium at 12:00 .  The endocervical canal was clear.  No lesions were seen.  Biopsies obtained: 12:00.  Hemostasis was adequate.  An endocervical curettage was performed.  Again, hemostasis was adequate. The procedure was terminated.  The patient tolerated her procedure well.  The specimens were sent to pathology.  Wet prep: Negative  Urine pregnancy test: Negative   Assessment:  Recurrent abnormal Pap smears. HPV. Irregular bleeding. Vaginal discharge. Anemia. Low vitamin D. Fatigue.  Plan:  Biopsy at 12:00 and ECC sent to pathology. Check  vitamin D and CBC. Ortho-Novum 1/35 sent pharmacy. Risk and benefits discussed.  Return to office in 2 week(s).  Leonard Schwartz M.D.  06/13/2012 2:49 PM    Previous Pap Smear: EPITHELIAL CELL ABNORMALITY: SQUAMOUS CELLS ATYPICAL SQUAMOUS CELLS OF UNDETERMINED SIGNIFICANCE (ASC-US).  Previous Colposcopy: N/A Referred From: N/A LMP: 05/19/2012 Contraception: None G,P: 2:1

## 2012-06-14 LAB — CBC
HCT: 36.5 % (ref 36.0–46.0)
Hemoglobin: 11.4 g/dL — ABNORMAL LOW (ref 12.0–15.0)
WBC: 5.2 10*3/uL (ref 4.0–10.5)

## 2012-06-17 LAB — PATHOLOGY

## 2012-06-24 ENCOUNTER — Telehealth: Payer: Self-pay | Admitting: Obstetrics and Gynecology

## 2012-06-24 NOTE — Telephone Encounter (Signed)
Spoke with pt rgd msg pt wants colpo results informed results are in but not signed off yet once reviewed and provider give recommendation will call with results pt c/o spotting after intercourse it happened once offered pt an appt for eval pt declines has appt next week with AVS also advised pt monitor bleeding and discuss at visit pt voice understanding

## 2012-06-24 NOTE — Telephone Encounter (Signed)
Lm on vm tcb rgd msg 

## 2012-07-02 ENCOUNTER — Ambulatory Visit (INDEPENDENT_AMBULATORY_CARE_PROVIDER_SITE_OTHER): Payer: BC Managed Care – PPO | Admitting: Obstetrics and Gynecology

## 2012-07-02 ENCOUNTER — Encounter: Payer: Self-pay | Admitting: Obstetrics and Gynecology

## 2012-07-02 VITALS — BP 118/72 | Wt 182.0 lb

## 2012-07-02 DIAGNOSIS — R5383 Other fatigue: Secondary | ICD-10-CM

## 2012-07-02 DIAGNOSIS — B977 Papillomavirus as the cause of diseases classified elsewhere: Secondary | ICD-10-CM

## 2012-07-02 DIAGNOSIS — D649 Anemia, unspecified: Secondary | ICD-10-CM

## 2012-07-02 NOTE — Progress Notes (Deleted)
.  lc

## 2012-07-02 NOTE — Progress Notes (Signed)
HISTORY OF PRESENT ILLNESS  [name] is a 31 y.o. year old female,G2P1001, who presents for a problem visit.   The patient has a history of CIN-1 and HPV.  Colposcopy and biopsy showed koilocytic atypia. See report below.  The patient complains of fatigue.  Her hemoglobin was 11.4.  Her vitamin D level was normal.  Subjective:  Fatigue continues.  Objective:  BP 118/72  Wt 182 lb (82.555 kg)  LMP 06/25/2012   General: no distress  Exam deferred. patient is having her period now.  Assessment:  HPV Anemia Fatigued  Plan:  The above diagnoses were discussed.  The patient will take iron and vitamins twice each day.  She will also increase her vitamin C.  fiber for constipation was recommended.  Return to office in 6 month(s).   Kim Welch M.D.  07/02/2012 4:05 PM    Report Comments: FINAL DIAGNOSIS: A. Cervix- Biopsy, 12 o'clock:  Squamous mucosa with focal koilocytotic atypia. Negative for dysplasia. See comment.  B. Endocervix - Curettage:  Tissue did not survive processing; see gross description. COMMENT: Regarding specimen A, dysplasia is not identified in the current biopsy. However, the biopsy consists of ectocervical mucosa and the cervical transformation zone is not identified in multiple sections. Clinical correlation is recommended.  The above finding is associated with the patient's previous pap test [phone], [employer]).  Kim Bo, MD, FCAP Electronically Signed CLINICAL HISTORY: 796.9 SOURCE OF SPECIMEN: A: Cervix- Biopsy, 12 o'clock B: Endocervix - Curettage GROSS DESCRIPTION: Two containers are received: A . Received in a single container of formalin labeled with the patient's name and "12 o'clock" is a segment of pale tan soft tissue measuring 0.5 x 0.4 x 0.2 cm. The specimen is submitted entirely in cassette A. CA/lh B. Received in a single container of formalin labeled with the  patient's name and "#ECC" is a minute amount of pale tan mucoid material measuring 0.1 x 0.1 x 0.1 cm in aggregate dimensions. The specimen is filtereda nd entirely submitted in cassette B. DISCLAIMER:  Due to specimen fragility/size, the specimen may not survive processing.

## 2012-07-08 ENCOUNTER — Telehealth: Payer: Self-pay | Admitting: Obstetrics and Gynecology

## 2012-07-08 NOTE — Telephone Encounter (Signed)
Spoke with pt rgd msg pt c/o prolong bleeding wants to know if she can restart provera advised pt prolong bleeding due to starting bc pills advised pt will have irreg bleeding for first three months of starting pills pt voice understanding

## 2012-07-10 ENCOUNTER — Ambulatory Visit (INDEPENDENT_AMBULATORY_CARE_PROVIDER_SITE_OTHER): Payer: BC Managed Care – PPO | Admitting: Obstetrics and Gynecology

## 2012-07-10 ENCOUNTER — Encounter: Payer: Self-pay | Admitting: Obstetrics and Gynecology

## 2012-07-10 ENCOUNTER — Telehealth: Payer: Self-pay | Admitting: Obstetrics and Gynecology

## 2012-07-10 VITALS — BP 140/100 | HR 96 | Temp 99.2°F | Wt 185.0 lb

## 2012-07-10 DIAGNOSIS — N926 Irregular menstruation, unspecified: Secondary | ICD-10-CM

## 2012-07-10 MED ORDER — NORETHINDRONE-ETH ESTRADIOL 1-35 MG-MCG PO TABS: ORAL_TABLET | ORAL | Status: DC

## 2012-07-10 NOTE — Progress Notes (Signed)
When did bleeding start: 06/22/12 How  Long: still going and got hvy 07/09/12 How often changing pad/tampon: today;every 30 min. Bleeding Disorders: no Cramping: yes Contraception: yes Fibroids: yes Hormone Therapy: no New Medications: no Menopausal Symptoms: no Vag. Discharge: no Abdominal Pain: yes Increased Stress: no

## 2012-07-10 NOTE — Telephone Encounter (Signed)
Took in-coming call from pt who states she has been bleeding since 06/22/12, but for the past couple of days, she has been bleeding extremely heavy. She is changing overnight pads q 30-60 mins and passing 1/2 dollar sized clots. She is cramping slightly now and feeling weak and dizzy. 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 Melody Comas A

## 2012-07-10 NOTE — Progress Notes (Signed)
31 YO complains of bleeding since 06/22/12 requiring the change of protection (overnight/long pads)  every 30 minutes x 2 days prior to that only changed twice a day. Admits to cramping 6/10 on a 10 point pain scale but has not needed any pain medication.  Patient has a history of fibroids and has been taking BCPs that she started at unknown point in cycle (October 27th) because that's when she got her pills.  Denies dizziness, nausea, vomiting, fever or severe pelvic pain. H/H (06/13/12) =  11.4/36.5 O:  UPT-negative  A: Menorrhagia with history of the same     New Start BCPs     Fibroids  P:  Ortho Novum 1/30  tid x 7 days then po daily x 90 days, placebos x 7 days then repeat cycle       (patient wants to take pills continuously)        RTO-as scheduled  Aanshi Batchelder, PA-C

## 2012-08-19 ENCOUNTER — Telehealth: Payer: Self-pay | Admitting: Obstetrics and Gynecology

## 2012-08-19 NOTE — Telephone Encounter (Signed)
Tc to pt concerning message. Pt states that she has been on her cycle for 24 days.She states that it is off and on. Asked pt is she soaking a pad an hour and pt states no. Scheduled pt an appt. On 08/22/12 with EP for eval. Advised pt that if she starts to soak a pad an hour or start having severe abdominal pain,to give [place] a call back. Pt voiced understanding.

## 2012-08-22 ENCOUNTER — Encounter: Payer: Self-pay | Admitting: Obstetrics and Gynecology

## 2012-08-22 ENCOUNTER — Ambulatory Visit (INDEPENDENT_AMBULATORY_CARE_PROVIDER_SITE_OTHER): Payer: BC Managed Care – PPO | Admitting: Obstetrics and Gynecology

## 2012-08-22 VITALS — BP 130/80 | Temp 99.2°F | Wt 188.0 lb

## 2012-08-22 DIAGNOSIS — N921 Excessive and frequent menstruation with irregular cycle: Secondary | ICD-10-CM

## 2012-08-22 DIAGNOSIS — N949 Unspecified condition associated with female genital organs and menstrual cycle: Secondary | ICD-10-CM

## 2012-08-22 DIAGNOSIS — N926 Irregular menstruation, unspecified: Secondary | ICD-10-CM

## 2012-08-22 DIAGNOSIS — N920 Excessive and frequent menstruation with regular cycle: Secondary | ICD-10-CM

## 2012-08-22 DIAGNOSIS — R102 Pelvic and perineal pain: Secondary | ICD-10-CM

## 2012-08-22 LAB — POCT URINALYSIS DIPSTICK
Glucose, UA: NEGATIVE
Nitrite, UA: NEGATIVE
Urobilinogen, UA: NEGATIVE

## 2012-08-22 LAB — POCT URINE PREGNANCY: Preg Test, Ur: NEGATIVE

## 2012-08-22 MED ORDER — MEDROXYPROGESTERONE ACETATE 10 MG PO TABS: 10.0000 mg | ORAL_TABLET | Freq: Every day | ORAL | Status: DC

## 2012-08-22 NOTE — Progress Notes (Signed)
When did bleeding start: dec 6,2013 How  Long: still going How often changing pad/tampon: during day; not often but at night;2x Bleeding Disorders: no Cramping: yes Contraception: yes Fibroids: yes Hormone Therapy: no New Medications: no Menopausal Symptoms: no Vag. Discharge: no Abdominal Pain: yes just cramps  Increased Stress: no

## 2012-08-22 NOTE — Progress Notes (Signed)
31 YO patient was previously on [employer] and began in October Alycen 1/35 and has been bleeding daily since.  Has to change pad several times a day. Has cramping rated as 5/10 on 10 point pain scale but is relieved with Midol.  Had an ultrasound August 2012 with #2 fibroids: 3.43 cm & 1.93 cm and a history of CIN-I with PAP smears every 6 months. Denies missed BCPs or new medications. Denies vaginitis symptoms, urinary tract or bowel symptoms. This year normal TSH and borderline low Hgb  O:  UPT-negative       U/A-negative  Abdomen: soft, diffusely tender without guarding or rebound  Pelvic: EGBUS-blood stained but no lesions, vagina-large blood, cervix-no lesions, uterus 10-12 weeks size, irregular & tender, adnexae-no masses  A: Menometrorrhagia     Fibroids   P:  SHG-pending       Continue BCP and take Provera 10 mg #30 1 po daily until [facility]       REviewed SHG procedure along with management of fibroids for those who have not completed      childbearing       RTO-as scheduled or prn  Laisha Rau, PA-C

## 2012-08-29 ENCOUNTER — Telehealth: Payer: Self-pay | Admitting: Obstetrics and Gynecology

## 2012-08-29 NOTE — Telephone Encounter (Signed)
Spoke with pt rgd msg pt c/o bleeding while on provera advised pt usually takes two weeks to see difference in bleeding either it lightens up or go away completely pt c/o headaches and cramping advised pt to take tylenol ibuprofen pt voice understanding

## 2012-09-02 ENCOUNTER — Other Ambulatory Visit: Payer: Self-pay | Admitting: Obstetrics and Gynecology

## 2012-09-12 ENCOUNTER — Ambulatory Visit: Payer: BC Managed Care – PPO | Admitting: Obstetrics and Gynecology

## 2012-09-12 ENCOUNTER — Ambulatory Visit: Payer: BC Managed Care – PPO

## 2012-09-12 ENCOUNTER — Encounter: Payer: Self-pay | Admitting: Obstetrics and Gynecology

## 2012-09-12 ENCOUNTER — Other Ambulatory Visit: Payer: Self-pay | Admitting: Obstetrics and Gynecology

## 2012-09-12 VITALS — BP 120/80 | Ht 63.0 in | Wt 186.0 lb

## 2012-09-12 DIAGNOSIS — N921 Excessive and frequent menstruation with irregular cycle: Secondary | ICD-10-CM

## 2012-09-12 DIAGNOSIS — D219 Benign neoplasm of connective and other soft tissue, unspecified: Secondary | ICD-10-CM

## 2012-09-12 MED ORDER — MEDROXYPROGESTERONE ACETATE 10 MG PO TABS: 10.0000 mg | ORAL_TABLET | Freq: Every day | ORAL | Status: DC

## 2012-09-12 NOTE — Progress Notes (Signed)
SHG uterus 7.78 cm by 8.10 cm  Normal ovaries Five fibroids noted.  The largest is 3.90 cm On SHG two separate masses are noted.   1. Fibroid 3.7 cm 2. 1.3 cm mass c/w polyp All treatments for polyps and fibroids reviewed with the pt. Including the R&B Pt will call with final decision.  She does still desire child bearing Pt to stop ocps and provera in one week.  Restart ocps as scheduled.  If abnormal bleeding will s/c ocps andjust take provera

## 2012-09-20 ENCOUNTER — Telehealth: Payer: Self-pay | Admitting: Obstetrics and Gynecology

## 2012-09-20 NOTE — Telephone Encounter (Signed)
TC to pt. States Had sonohyst 09/12/12. D/C'd OCP and Provera 09/17/12.LMP 09/18/12. Has had increased bleeding 09/19/12. Last night changed soaked pad q 1 1/2 hours.  Has taken Midol and Ibuprofen for cramping with little relief. Per DR ND to take Ibuprofen 600mg  q 6 hours and restart OCP's. Pt to call if bleeding pad or more/hour, no improvement or other concerns. Given instructions to reach after hours. Pt verbalizes comprehension.

## 2012-10-05 ENCOUNTER — Other Ambulatory Visit: Payer: Self-pay

## 2012-10-09 ENCOUNTER — Telehealth: Payer: Self-pay | Admitting: Obstetrics and Gynecology

## 2012-10-09 ENCOUNTER — Encounter: Payer: Self-pay | Admitting: Obstetrics and Gynecology

## 2012-10-09 ENCOUNTER — Ambulatory Visit: Payer: BC Managed Care – PPO | Admitting: Obstetrics and Gynecology

## 2012-10-09 VITALS — BP 128/68 | Ht 63.0 in | Wt 189.0 lb

## 2012-10-09 DIAGNOSIS — D259 Leiomyoma of uterus, unspecified: Secondary | ICD-10-CM

## 2012-10-09 DIAGNOSIS — D219 Benign neoplasm of connective and other soft tissue, unspecified: Secondary | ICD-10-CM

## 2012-10-09 DIAGNOSIS — N898 Other specified noninflammatory disorders of vagina: Secondary | ICD-10-CM

## 2012-10-09 LAB — POCT WET PREP (WET MOUNT)
KOH Wet Prep POC: NEGATIVE
Trichomonas Wet Prep HPF POC: NEGATIVE

## 2012-10-09 LAB — POCT URINALYSIS DIPSTICK
Protein, UA: NEGATIVE
Spec Grav, UA: <=1.005
Urobilinogen, UA: NEGATIVE

## 2012-10-09 MED ORDER — TERCONAZOLE 80 MG VA SUPP: VAGINAL | Status: DC

## 2012-10-09 MED ORDER — NYSTATIN-TRIAMCINOLONE 100000-0.1 UNIT/GM-% EX OINT: TOPICAL_OINTMENT | Freq: Three times a day (TID) | CUTANEOUS | Status: DC | PRN

## 2012-10-09 NOTE — Telephone Encounter (Signed)
Pt called and stated that she has a consultation w/ SR today for robotic surgery and wants to know if she will also see her or give her a rx for her sxs of yeast infection.  Pt advised that she will need to be examined for that, but to mention her concerns to SR about her sxs and see if she will examine her today, pt voices agreement.

## 2012-10-09 NOTE — Progress Notes (Signed)
Subjective:    Kim Welch is a 32 y.o. female, G2P1001, who presents for consult for Robotic surgery and co of vaginal itching and some discharge x 3 days. Pt states skin is real raw.  Previous SHG 08/2012 reviewed: 5 measurable fibroids with 4 intramural measuring from 2 to 3.3 cm and 1 submucosal measuring 3 cm.  The following portions of the patient's history were reviewed and updated as appropriate: allergies, current medications, past family history.  Review of Systems Pertinent items are noted in HPI.    Objective:    BP 128/68  Ht 5\' 3"  (1.6 m)  Wt 189 lb (85.73 kg)  BMI 33.49 kg/m2  LMP 09/18/2012    Weight:  Wt Readings from Last 1 Encounters:  09/12/12 186 lb (84.369 kg)          BMI: Body mass index is 33.49 kg/(m^2).  General Appearance: Alert, appropriate appearance for age. No acute distress GYN exam: diffuse vulvar erythema, thick white discharge  Wet prep: yeast   Assessment:    Symptomatic fibroids and yeast vaginitis    Plan:    1. Terazol 3 and Mycolog II 2. Fibroids:  The following was reviewed with patient:    Benign nature of fibroids as well as unpredictable growth during reproductive years.      Possible fibroid symptoms including: menorrhagia, dysmenorrhea, urinary frequency,       pelvic pain, and back pain.     Expected resolution/improvement of symptoms with menopausal state.    Treatment options: expectant management, NSAIDS, oral contraceptives,       Depo Provera and myomectomy  Surgical approach options were also reviewed including, laparoscopic, robotically assisted and abdominal. Risks, benefits and limitations also discussed. I do not recommend robotic myomectomy because will not be able to remove at least 3 or 4 of the known fibroids and the procedure would require hysteroscopic management of the submucosal fibroid anyway.  HYSTEROSCOPIC MYOMECTOMY  Or via laparotomy suggested: The procedure was reviewed with patient with  expected benefits.  Risks including but not limited to bleeding, infection, uterine perforation with possible intra-abdominal organ damage and need to stay overnight +/- require exploratory laparoscopy were also reviewed.  Post-operative recovery and expectations were also discussed and all questions were answered.   After 40  minutes face to face encounter, the patient will call back with decision   Silverio Lay MD

## 2012-11-11 ENCOUNTER — Telehealth: Payer: Self-pay | Admitting: Obstetrics and Gynecology

## 2013-01-11 IMAGING — CR DG ABDOMEN 2V
2 series · 2 of 2 positions shown · non-contrast
Comparison: None.

CLINICAL DATA: IUD migration, IUD not within the uterus

ABDOMEN - 1 VIEW

[t abdomen supine]
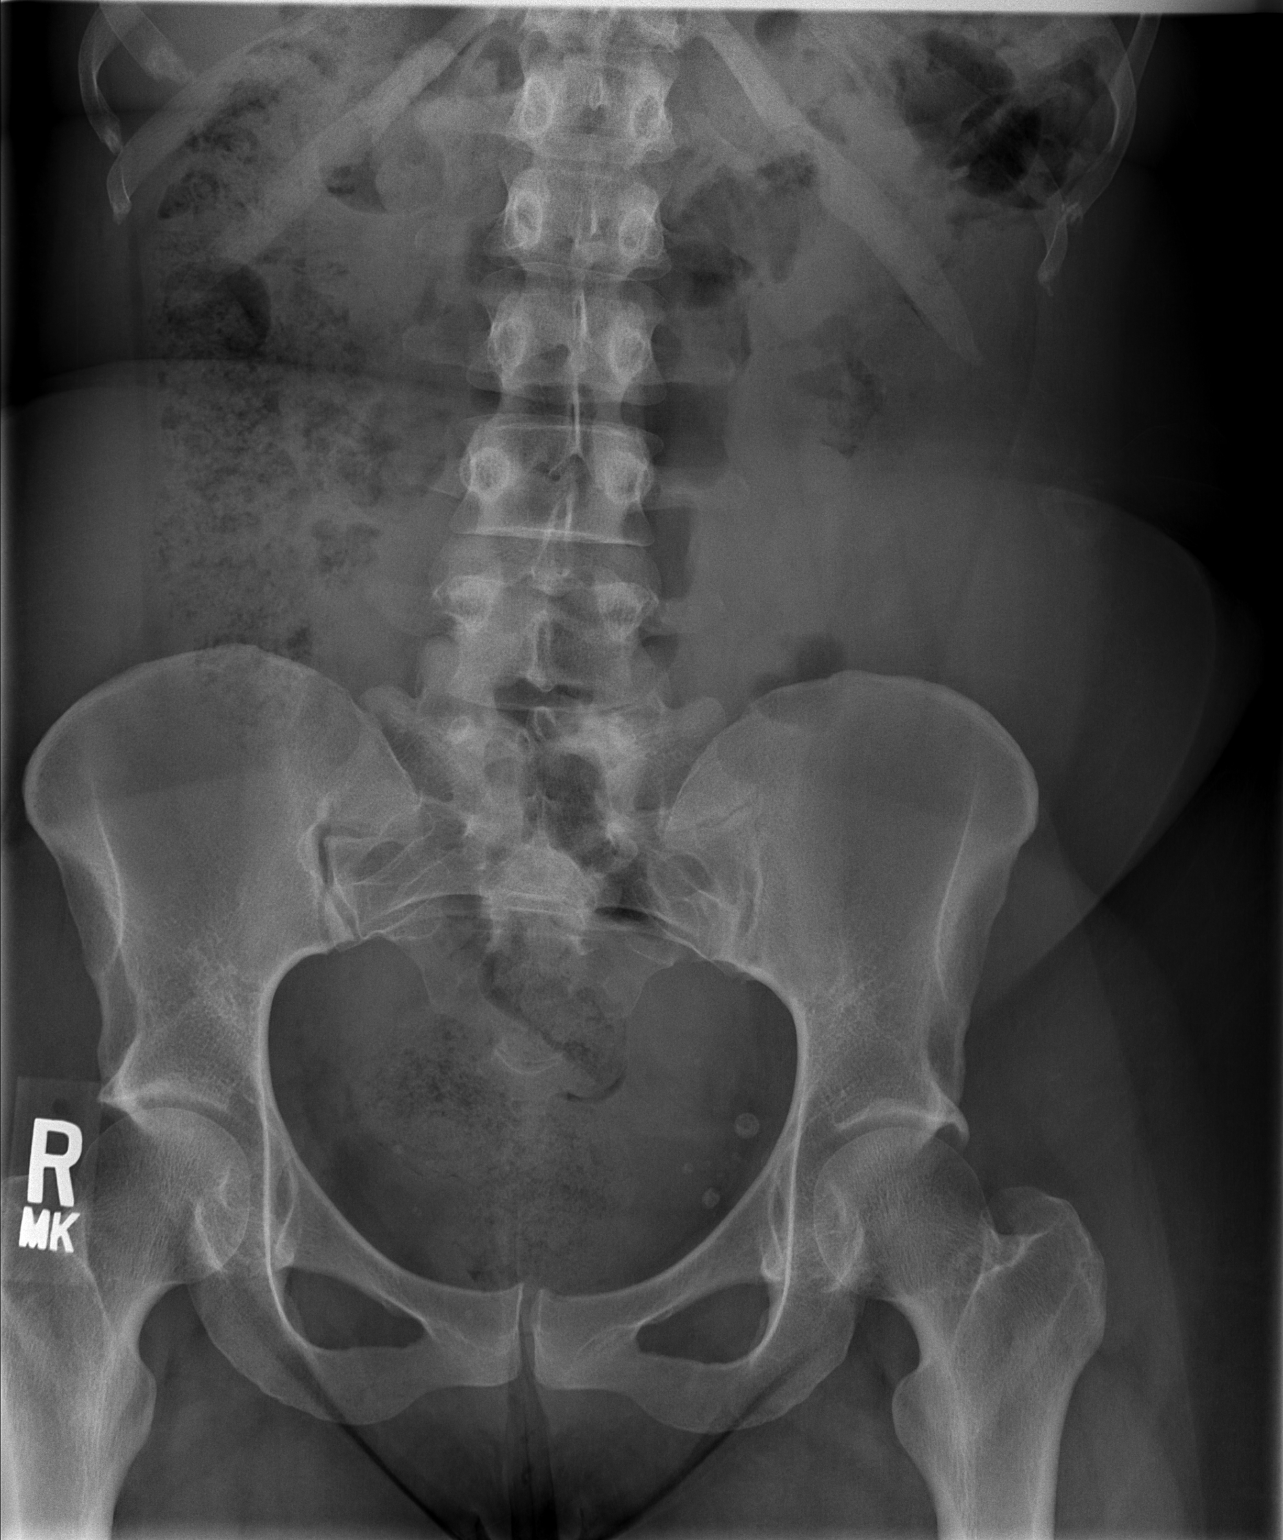

[w abdomen upright]
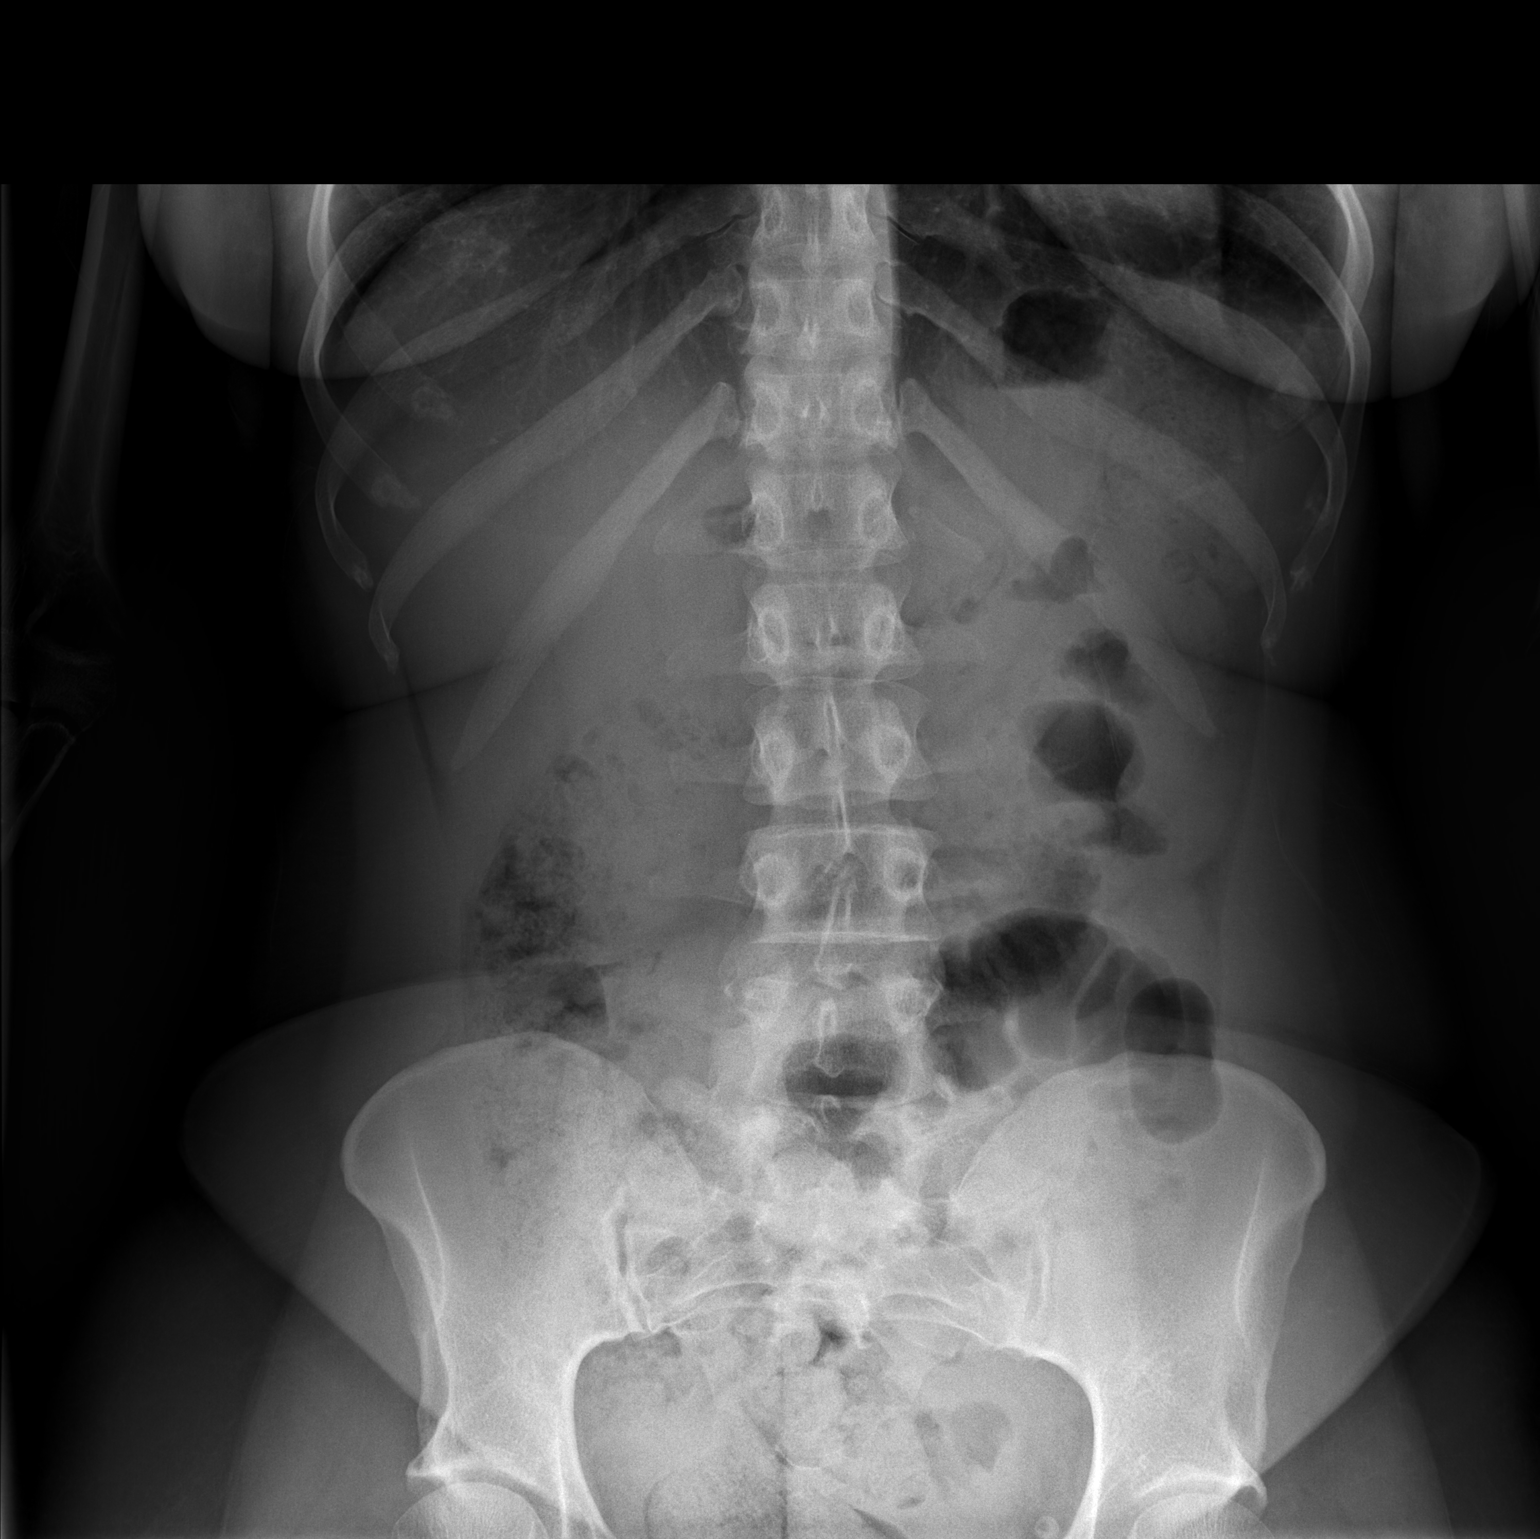

[2 of 2 positions shown; findings below may reference images not displayed]

FINDINGS: An IUD is not identified within the imaged abdomen or
pelvis.  Moderate colonic stool burden without definite evidence of
obstruction.  Evaluate for pneumoperitoneum is limited secondary to
supine patient positioning exclusion of the lower thorax.  No acute
osseous abnormalities.
IMPRESSION: An IUP is not identified within the imaged abdomen or pelvis.  Note
the lower thorax was not imaged.

## 2013-06-10 ENCOUNTER — Encounter (HOSPITAL_COMMUNITY): Payer: Self-pay | Admitting: Pharmacist

## 2013-06-24 ENCOUNTER — Encounter (HOSPITAL_COMMUNITY)
Admission: RE | Admit: 2013-06-24 | Discharge: 2013-06-24 | Disposition: A | Discharge status: Home / Self Care | Payer: BC Managed Care – PPO | Source: Ambulatory Visit | Attending: Obstetrics and Gynecology | Admitting: Obstetrics and Gynecology

## 2013-06-24 ENCOUNTER — Encounter (HOSPITAL_COMMUNITY): Payer: Self-pay

## 2013-06-24 HISTORY — DX: Essential (primary) hypertension: I10

## 2013-06-24 HISTORY — DX: Anxiety disorder, unspecified: F41.9

## 2013-06-24 LAB — BASIC METABOLIC PANEL
CO2: 29 mEq/L (ref 19–32)
Calcium: 10 mg/dL (ref 8.4–10.5)
GFR calc Af Amer: >90 mL/min (ref >90)
GFR calc non Af Amer: >90 mL/min (ref >90)
Sodium: 134 mEq/L — ABNORMAL LOW (ref 135–145)

## 2013-06-24 LAB — CBC
MCH: 25 pg — ABNORMAL LOW (ref 26.0–34.0)
MCHC: 33.3 g/dL (ref 30.0–36.0)
Platelets: 309 10*3/uL (ref 150–400)
RBC: 5.31 MIL/uL — ABNORMAL HIGH (ref 3.87–5.11)
RDW: 14.3 % (ref 11.5–15.5)

## 2013-06-24 NOTE — Patient Instructions (Addendum)
Your procedure is scheduled on:06/27/13  Enter through the Main Entrance at :0730 Pick up desk phone and dial [phone] and inform us of your arrival.  Please call [phone] if you have any problems the morning of surgery.  Remember: Do not eat food or drink liquids, including water, after midnight:Thursday   You may brush your teeth the morning of surgery.  Take these meds the morning of surgery with a sip of water: BP med  DO NOT wear jewelry, eye make-up, lipstick,body lotion, or dark fingernail polish.  (Polished toes are ok) You may wear deodorant.  If you are to be admitted after surgery, leave suitcase in car until your room has been assigned. Patients discharged on the day of surgery will not be allowed to drive home. Wear loose fitting, comfortable clothes for your ride home.

## 2013-06-26 NOTE — H&P (Signed)
Kim Welch 32 y.o. female. Who presents with heavy and irreg vaginal bleeding for one year.  .  She has uses 1-2 pads/ tampons every hour while menstruating.  She denies any CP or SOB.  Nothing makes it better.  Nothing makes it worse.  6/10dysmenorrhea.  Pt has tried  Many forms of birth control without success. Pertinent Gynecological History: Contraception: Education given regarding options for contraception, including injectable contraception, IUD placement, oral contraceptives. Blood transfusions: none Sexually transmitted diseases: HSV Previous GYN Procedures: colposcopy Last mammogram:  Last pap: normal Date: ascus OB History: G1P1   Menstrual History: Menarche age: 32  No LMP recorded.    Past Medical History  Diagnosis Date  . Abnormal Pap smear   . 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 Menorrhagia   . Fibroids   . Dysmenorrhea   . Anemia   . Hypertension   . Asthma     SEASONAL  . Anxiety    Past Surgical History  Procedure Laterality Date  . No past surgeries     No current facility-administered medications for this encounter. Current outpatient prescriptions:hydrochlorothiazide (HYDRODIURIL) 25 MG tablet, Take 25 mg by mouth daily., Disp: , Rfl: ;  nystatin-triamcinolone ointment (MYCOLOG), Apply topically 3 (three) times daily as needed., Disp: 60 g, Rfl: 0;  sertraline (ZOLOFT) 25 MG tablet, Take 25 mg by mouth daily., Disp: , Rfl: ;  valACYclovir (VALTREX) 500 MG tablet, TAKE 1 TABLET EVERY DAY as needed, Disp: , Rfl:  Allergies  Allergen Reactions  . Penicillins     REACTION: vomiting   Review of Systems - Negative except for above and history   Physical Exam  There were no vitals taken for this visit. Constitutional: She appears well-developed and well-nourished.  HENT:  Head: Normocephalic.  Eyes: Pupils are equal, round, and reactive to light.  Neck: Normal range of motion. Neck supple.  Cardiovascular: Regular rhythm.    Respiratory: Effort normal and breath sounds normal.  GI: Soft.  Genitourinary: normal vulva and vagina.  9 week size slightly irrregular uterus.  Musculoskeletal: Normal range of motion.  Neurological: She is alert.  Skin: Skin is warm.  Psychiatric: She has a normal mood and affect.  Results for orders placed during the hospital encounter of 06/24/13 (from the past 72 hour(s))  CBC     Status: Abnormal   Collection Time    06/24/13  4:20 PM      Result Value Range   WBC 7.4  4.0 - 10.5 K/uL   RBC 5.31 (*) 3.87 - 5.11 MIL/uL   Hemoglobin 13.3  12.0 - 15.0 g/dL   HCT 16.1  09.6 - 04.5 %   MCV 75.3 (*) 78.0 - 100.0 fL   MCH 25.0 (*) 26.0 - 34.0 pg   MCHC 33.3  30.0 - 36.0 g/dL   RDW 40.9  81.1 - 91.4 %   Platelets 309  150 - 400 K/uL  BASIC METABOLIC PANEL     Status: Abnormal   Collection Time    06/24/13  4:20 PM      Result Value Range   Sodium 134 (*) 135 - 145 mEq/L   Potassium 3.5  3.5 - 5.1 mEq/L   Chloride 96  96 - 112 mEq/L   CO2 29  19 - 32 mEq/L   Glucose, Bld 80  70 - 99 mg/dL   BUN 9  6 - 23 mg/dL   Creatinine, Ser 7.82  0.50 - 1.10 mg/dL  Calcium 10.0  8.4 - 10.5 mg/dL   GFR calc non Af Amer >90  >90 mL/min   GFR calc Af Amer >90  >90 mL/min   Comment: (NOTE)     The eGFR has been calculated using the CKD EPI equation.     This calculation has not been validated in all clinical situations.     eGFR's persistently <90 mL/min signify possible Chronic Kidney     Disease.   [place] width9.2  Length5.4 Ovariesnormal 2.1 cm polyp seen  Assessment/Plan: Menorrhagia Fibroids Endometrial polyp seen on [place] Pt offered  obs vs surgery.  Pt chose surgery.  Plan D&C hysteroscopy polypectomy.  Risks are but not limited to bleeding, infection, scarring of the uterus and perforation.     Donice Alperin A 07/10/2011, 11:41 AM

## 2013-06-27 ENCOUNTER — Encounter (HOSPITAL_COMMUNITY): Payer: Self-pay | Admitting: *Deleted

## 2013-06-27 ENCOUNTER — Encounter (HOSPITAL_COMMUNITY): Payer: BC Managed Care – PPO | Admitting: Anesthesiology

## 2013-06-27 ENCOUNTER — Ambulatory Visit (HOSPITAL_COMMUNITY): Payer: BC Managed Care – PPO | Admitting: Anesthesiology

## 2013-06-27 ENCOUNTER — Ambulatory Visit (HOSPITAL_COMMUNITY)
Admission: RE | Admit: 2013-06-27 | Discharge: 2013-06-27 | Disposition: A | Discharge status: Home / Self Care | Payer: BC Managed Care – PPO | Source: Ambulatory Visit | Attending: Obstetrics and Gynecology | Admitting: Obstetrics and Gynecology

## 2013-06-27 ENCOUNTER — Encounter (HOSPITAL_COMMUNITY)
Admission: RE | Disposition: A | Discharge status: Home / Self Care | Payer: Self-pay | Source: Ambulatory Visit | Attending: Obstetrics and Gynecology

## 2013-06-27 DIAGNOSIS — N840 Polyp of corpus uteri: Secondary | ICD-10-CM | POA: Insufficient documentation

## 2013-06-27 DIAGNOSIS — N920 Excessive and frequent menstruation with regular cycle: Secondary | ICD-10-CM | POA: Insufficient documentation

## 2013-06-27 DIAGNOSIS — D259 Leiomyoma of uterus, unspecified: Secondary | ICD-10-CM | POA: Insufficient documentation

## 2013-06-27 DIAGNOSIS — D250 Submucous leiomyoma of uterus: Secondary | ICD-10-CM

## 2013-06-27 HISTORY — PX: DILATATION & CURETTAGE/HYSTEROSCOPY WITH TRUECLEAR: SHX6353

## 2013-06-27 LAB — PREGNANCY, URINE: Preg Test, Ur: NEGATIVE

## 2013-06-27 SURGERY — DILATATION & CURETTAGE/HYSTEROSCOPY WITH TRUCLEAR
Anesthesia: General | Wound class: Clean Contaminated

## 2013-06-27 MED ORDER — DEXAMETHASONE SODIUM PHOSPHATE 10 MG/ML IJ SOLN
INTRAMUSCULAR | Status: AC
  Filled 2013-06-27: qty 1

## 2013-06-27 MED ORDER — LACTATED RINGERS IV SOLN
INTRAVENOUS | Status: DC
  Administered 2013-06-27 (×2): via INTRAVENOUS

## 2013-06-27 MED ORDER — FENTANYL CITRATE 0.05 MG/ML IJ SOLN
INTRAMUSCULAR | Status: AC
  Filled 2013-06-27: qty 5

## 2013-06-27 MED ORDER — MIDAZOLAM HCL 2 MG/2ML IJ SOLN
INTRAMUSCULAR | Status: AC
  Filled 2013-06-27: qty 2

## 2013-06-27 MED ORDER — PROPOFOL 10 MG/ML IV BOLUS
INTRAVENOUS | Status: DC | PRN
  Administered 2013-06-27: 200 mg via INTRAVENOUS

## 2013-06-27 MED ORDER — KETOROLAC TROMETHAMINE 30 MG/ML IJ SOLN
INTRAMUSCULAR | Status: DC | PRN
  Administered 2013-06-27: 30 mg via INTRAVENOUS

## 2013-06-27 MED ORDER — DEXAMETHASONE SODIUM PHOSPHATE 4 MG/ML IJ SOLN
INTRAMUSCULAR | Status: DC | PRN
  Administered 2013-06-27: 10 mg via INTRAVENOUS

## 2013-06-27 MED ORDER — ONDANSETRON HCL 4 MG/2ML IJ SOLN: 4.0000 mg | Freq: Once | INTRAMUSCULAR | Status: DC | PRN

## 2013-06-27 MED ORDER — ONDANSETRON HCL 4 MG/2ML IJ SOLN
INTRAMUSCULAR | Status: DC | PRN
  Administered 2013-06-27: 4 mg via INTRAVENOUS

## 2013-06-27 MED ORDER — ONDANSETRON HCL 4 MG/2ML IJ SOLN
INTRAMUSCULAR | Status: AC
  Filled 2013-06-27: qty 2

## 2013-06-27 MED ORDER — LIDOCAINE HCL (CARDIAC) 20 MG/ML IV SOLN
INTRAVENOUS | Status: DC | PRN
  Administered 2013-06-27: 60 mg via INTRAVENOUS

## 2013-06-27 MED ORDER — IBUPROFEN 600 MG PO TABS: 600.0000 mg | ORAL_TABLET | Freq: Four times a day (QID) | ORAL | Status: DC | PRN

## 2013-06-27 MED ORDER — FENTANYL CITRATE 0.05 MG/ML IJ SOLN
INTRAMUSCULAR | Status: DC | PRN
  Administered 2013-06-27 (×2): 50 ug via INTRAVENOUS

## 2013-06-27 MED ORDER — KETOROLAC TROMETHAMINE 30 MG/ML IJ SOLN: 15.0000 mg | Freq: Once | INTRAMUSCULAR | Status: DC | PRN

## 2013-06-27 MED ORDER — LIDOCAINE HCL (CARDIAC) 20 MG/ML IV SOLN
INTRAVENOUS | Status: AC
  Filled 2013-06-27: qty 5

## 2013-06-27 MED ORDER — MIDAZOLAM HCL 2 MG/2ML IJ SOLN
INTRAMUSCULAR | Status: DC | PRN
  Administered 2013-06-27: 1 mg via INTRAVENOUS

## 2013-06-27 MED ORDER — KETOROLAC TROMETHAMINE 30 MG/ML IJ SOLN
INTRAMUSCULAR | Status: AC
  Filled 2013-06-27: qty 1

## 2013-06-27 MED ORDER — LIDOCAINE HCL 2 % IJ SOLN
INTRAMUSCULAR | Status: AC
  Filled 2013-06-27: qty 20

## 2013-06-27 MED ORDER — MEPERIDINE HCL 25 MG/ML IJ SOLN: 6.2500 mg | INTRAMUSCULAR | Status: DC | PRN

## 2013-06-27 MED ORDER — FENTANYL CITRATE 0.05 MG/ML IJ SOLN: 25.0000 ug | INTRAMUSCULAR | Status: DC | PRN

## 2013-06-27 MED ORDER — HYDROCODONE-ACETAMINOPHEN 5-325 MG PO TABS: 1.0000 | ORAL_TABLET | Freq: Four times a day (QID) | ORAL | Status: DC | PRN

## 2013-06-27 MED ORDER — PROPOFOL 10 MG/ML IV EMUL
INTRAVENOUS | Status: AC
  Filled 2013-06-27: qty 20

## 2013-06-27 SURGICAL SUPPLY — 23 items
BLADE INCISOR TRUC PLUS 2.9 (ABLATOR) IMPLANT
CANISTERS HI-FLOW 3000CC (CANNISTER) ×2 IMPLANT
CATH ROBINSON RED A/P 16FR (CATHETERS) ×2 IMPLANT
CLOTH BEACON ORANGE TIMEOUT ST (SAFETY) ×2 IMPLANT
CONTAINER PREFILL 10% NBF 60ML (FORM) ×4 IMPLANT
DRAPE HYSTEROSCOPY (DRAPE) ×2 IMPLANT
DRESSING TELFA 8X3 (GAUZE/BANDAGES/DRESSINGS) ×2 IMPLANT
ELECT REM PT RETURN 9FT ADLT (ELECTROSURGICAL)
ELECTRODE REM PT RTRN 9FT ADLT (ELECTROSURGICAL) ×1 IMPLANT
GLOVE BIO SURGEON STRL SZ 6.5 (GLOVE) ×2 IMPLANT
GLOVE BIOGEL PI IND STRL 7.0 (GLOVE) ×1 IMPLANT
GLOVE BIOGEL PI INDICATOR 7.0 (GLOVE) ×1
GOWN STRL REIN XL XLG (GOWN DISPOSABLE) ×4 IMPLANT
INCISOR TRUC PLUS BLADE 2.9 (ABLATOR)
KIT HYSTEROSCOPY TRUCLEAR (ABLATOR) ×1 IMPLANT
MORCELLATOR RECIP TRUCLEAR 4.0 (ABLATOR) ×1 IMPLANT
NDL SPNL 22GX3.5 QUINCKE BK (NEEDLE) ×1 IMPLANT
NEEDLE SPNL 22GX3.5 QUINCKE BK (NEEDLE) ×2 IMPLANT
PACK VAGINAL MINOR WOMEN LF (CUSTOM PROCEDURE TRAY) ×2 IMPLANT
PAD OB MATERNITY 4.3X12.25 (PERSONAL CARE ITEMS) ×2 IMPLANT
SYR 20CC LL (SYRINGE) ×2 IMPLANT
TOWEL OR 17X24 6PK STRL BLUE (TOWEL DISPOSABLE) ×4 IMPLANT
WATER STERILE IRR 1000ML POUR (IV SOLUTION) ×2 IMPLANT

## 2013-06-27 NOTE — Transfer of Care (Signed)
Immediate Anesthesia Transfer of Care Note  Patient: Kim Welch  Procedure(s) Performed: Procedure(s): DILATATION & CURETTAGE/HYSTEROSCOPY WITH TRUECLEAR (N/A)  Patient Location: PACU  Anesthesia Type:General  Level of Consciousness: awake, alert , oriented and patient cooperative  Airway & Oxygen Therapy: Patient Spontanous Breathing and Patient connected to nasal cannula oxygen  Post-op Assessment: Report given to PACU RN and Post -op Vital signs reviewed and stable  Post vital signs: Reviewed and stable  Complications: No apparent anesthesia complications

## 2013-06-27 NOTE — Anesthesia Preprocedure Evaluation (Signed)
Anesthesia Evaluation  Patient identified by MRN, date of birth, ID band Patient awake    Reviewed: Allergy & Precautions, H&P , NPO status , Patient's Chart, lab work & pertinent test results  Airway Mallampati: I TM Distance: >3 FB Neck ROM: full    Dental no notable dental hx. (+) Teeth Intact   Pulmonary    Pulmonary exam normal       Cardiovascular hypertension, Pt. on medications     Neuro/Psych negative neurological ROS  negative psych ROS   GI/Hepatic negative GI ROS, Neg liver ROS,   Endo/Other  negative endocrine ROS  Renal/GU negative Renal ROS  negative genitourinary   Musculoskeletal negative musculoskeletal ROS (+)   Abdominal Normal abdominal exam  (+)   Peds  Hematology negative hematology ROS (+)   Anesthesia Other Findings   Reproductive/Obstetrics negative OB ROS                           Anesthesia Physical Anesthesia Plan  ASA: II  Anesthesia Plan: General   Post-op Pain Management:    Induction: Intravenous  Airway Management Planned: LMA  Additional Equipment:   Intra-op Plan:   Post-operative Plan:   Informed Consent: I have reviewed the patients History and Physical, chart, labs and discussed the procedure including the risks, benefits and alternatives for the proposed anesthesia with the patient or authorized representative who has indicated his/her understanding and acceptance.     Plan Discussed with: CRNA and Surgeon  Anesthesia Plan Comments:         Anesthesia Quick Evaluation

## 2013-06-27 NOTE — Interval H&P Note (Signed)
History and Physical Interval Note:  06/27/2013 9:03 AM  Kim Welch  has presented today for surgery, with the diagnosis of Endometrial Polyp - 60 minutes  The various methods of treatment have been discussed with the patient and family. After consideration of risks, benefits and other options for treatment, the patient has consented to  Procedure(s): DILATATION & CURETTAGE/HYSTEROSCOPY WITH TRUECLEAR (N/A) as a surgical intervention .  The patient's history has been reviewed, patient examined, no change in status, stable for surgery.  I have reviewed the patient's chart and labs.  Questions were answered to the patient's satisfaction.     [facility] A

## 2013-06-27 NOTE — Anesthesia Postprocedure Evaluation (Signed)
Anesthesia Post Note  Patient: Kim Welch  Procedure(s) Performed: Procedure(s) (LRB): DILATATION & CURETTAGE/HYSTEROSCOPY WITH TRUECLEAR (N/A)  Anesthesia type: General  Patient location: PACU  Post pain: Pain level controlled  Post assessment: Post-op Vital signs reviewed  Last Vitals:  Filed Vitals:   06/27/13 1015  BP: 117/73  Pulse: 64  Temp:   Resp: 17    Post vital signs: Reviewed  Level of consciousness: sedated  Complications: No apparent anesthesia complications

## 2013-06-28 NOTE — Op Note (Signed)
Pre op DX: Endometrial Polyp; CPT 58558 - 60 minutes   Post Op DX: Endometrial mass c/w a fibroid   PHYSICIAN : Daviyon Widmayer   ASSISTANTS: none   ANESTHESIA:   general and paracervical block  ESTIMATED BLOOD LOSS: minimal  LOCAL MEDICATIONS USED:  LIDOCAINE 20CC  SPECIMEN:  Source of Specimen:  endometrial curettings and fibroid  DISPOSITION OF SPECIMEN:  PATHOLOGY  COUNTS Correct:  YES    DICTATION #: The patient was taken to the operating room and prepped and draped in a normal sterile fashion. An in out catheter was used to drain the bladder.   A bivalve speculum was placed into the vagina and anterior lip of the cervix was grasped with a single-tooth tenaculum.  20 cc of 2% lidocaine was used for cervical block.  the cervix was then dilated with Shawnie Pons dilators up to 21. The hysteroscope was placed into the uterine cavity. The  entire uterus and both ostia were visualized. A 2cm mass was seen on the posterior wall of the uterus.  It was consistent with a fibroid.       Hyseroscope was then removed from the uterus.  The cervix was further dilated to 29.  trueclear morcelator was used to morcelate the fibroid in 10 minutes.  The entire fibroid was removed.  The hysteroscope was removed.   A sharp curettage was then done with a curette and endometrial curettings were obtained. The endometrial curettings were sent to pathology. . The tenaculum was removed from the cervix and hemostasis was noted.   PLAN OF CARE: discharge to home  PATIENT DISPOSITION:  PACU - hemodynamically stable.

## 2013-06-30 ENCOUNTER — Encounter (HOSPITAL_COMMUNITY): Payer: Self-pay | Admitting: Obstetrics and Gynecology

## 2013-09-25 ENCOUNTER — Other Ambulatory Visit: Payer: Self-pay | Admitting: Obstetrics and Gynecology

## 2013-11-10 ENCOUNTER — Other Ambulatory Visit: Payer: Self-pay | Admitting: Podiatry

## 2013-11-10 ENCOUNTER — Ambulatory Visit (INDEPENDENT_AMBULATORY_CARE_PROVIDER_SITE_OTHER): Payer: BC Managed Care – PPO | Admitting: Podiatry

## 2013-11-10 ENCOUNTER — Ambulatory Visit (INDEPENDENT_AMBULATORY_CARE_PROVIDER_SITE_OTHER): Payer: BC Managed Care – PPO

## 2013-11-10 ENCOUNTER — Encounter: Payer: Self-pay | Admitting: Podiatry

## 2013-11-10 VITALS — BP 148/100 | HR 112 | Resp 16 | Ht 63.0 in | Wt 195.0 lb

## 2013-11-10 DIAGNOSIS — M722 Plantar fascial fibromatosis: Secondary | ICD-10-CM

## 2013-11-10 MED ORDER — DICLOFENAC SODIUM 75 MG PO TBEC
75.0000 mg | DELAYED_RELEASE_TABLET | Freq: Two times a day (BID) | ORAL | Status: DC
Start: 2013-11-10 — End: 2015-07-16

## 2013-11-10 MED ORDER — TRIAMCINOLONE ACETONIDE 10 MG/ML IJ SUSP
10.0000 mg | Freq: Once | INTRAMUSCULAR | Status: AC
  Administered 2013-11-10: 10 mg

## 2013-11-10 NOTE — Patient Instructions (Signed)

## 2013-11-10 NOTE — Progress Notes (Signed)
   Subjective:    Patient ID: Kim Welch, female    DOB: September 27, 1980, 33 y.o.   MRN: [id number]  HPI Comments: "I have plantar fasciitis"  Patient C/O sharp plantar heel bilateral, left over right,  for 5 years. Does have AM pain and is usually constant through day. She did see Podiatrist 4 years ago and they tried cortisone x 2, night splints, and orthotics. None of that is helping now. She is a Pharmacist, hospital and on feet all day. Worsening.  Foot Pain      Review of Systems  Musculoskeletal: Positive for gait problem.  All other systems reviewed and are negative.       Objective:   Physical Exam        Assessment & Plan:

## 2013-11-11 NOTE — Progress Notes (Signed)
Subjective:     Patient ID: Kim Welch, female   DOB: May 18, 1981, 33 y.o.   MRN: [id number]  Foot Pain   patient presents with a five-year history of heel pain that responded well a number of years ago to injection treatment and orthotics which are no longer effective for her. States the pain has been intensifying over the last 6 months and is making a gradually harder for her to do activity   Review of Systems  All other systems reviewed and are negative.       Objective:   Physical Exam  Nursing note and vitals reviewed. Constitutional: She is oriented to person, place, and time.  Cardiovascular: Intact distal pulses.   Musculoskeletal: Normal range of motion.  Neurological: She is oriented to person, place, and time.  Skin: Skin is warm.   neurovascular status intact with no equinus condition noted and range of motion within normal limits. Patient is found to have some of the arch of both feet and is found to have moderate discomfort in the heel left over right with inflammation and fluid at the insertion of the tendon into the calcaneus    Assessment:     Plantar fasciitis heel left over right    Plan:     H&P and x-rays reviewed. Injected the plantar fascia both feet 3 mg Kenalog 5 mg Xylocaine Marcaine mixture dispensed fascially brace for the last 1 and instructed on physical therapy. She needs new orthotics and she is scanned for custom orthotics do to history of wearing them and no longer a fact

## 2013-11-24 ENCOUNTER — Ambulatory Visit (INDEPENDENT_AMBULATORY_CARE_PROVIDER_SITE_OTHER): Payer: BC Managed Care – PPO | Admitting: Podiatry

## 2013-11-24 ENCOUNTER — Encounter: Payer: Self-pay | Admitting: Podiatry

## 2013-11-24 VITALS — BP 137/93 | HR 86 | Resp 16

## 2013-11-24 DIAGNOSIS — M722 Plantar fascial fibromatosis: Secondary | ICD-10-CM

## 2013-11-24 MED ORDER — TRIAMCINOLONE ACETONIDE 10 MG/ML IJ SUSP
10.0000 mg | Freq: Once | INTRAMUSCULAR | Status: AC
  Administered 2013-11-24: 10 mg

## 2013-11-24 NOTE — Progress Notes (Signed)
Subjective:     Patient ID: Kim Welch, female   DOB: 24-Jun-1981, 33 y.o.   MRN: [id number]  HPI patient presents stating her left heel has really started to bother her again and she is here to pick up her orthotics   Review of Systems     Objective:   Physical Exam Neurovascular status intact with discomfort to the plantar aspect left heel with inflammation and fluid around the tendon as it inserts into the calcaneus    Assessment:     Plantar fasciitis left with inflammation    Plan:     Dispensed orthotics with instructions reviewed physical therapy and shoe gear modifications and reinjected the plantar fascia 3 mg Kenalog 5 mg Xylocaine Marcaine mixture. Reappoint 4 weeks

## 2013-11-24 NOTE — Patient Instructions (Signed)

## 2013-12-22 ENCOUNTER — Ambulatory Visit: Payer: BC Managed Care – PPO | Admitting: Podiatry

## 2014-03-10 ENCOUNTER — Telehealth: Payer: Self-pay | Admitting: *Deleted

## 2014-03-10 NOTE — Telephone Encounter (Signed)
Calling in reference to ankle brace that I received.  I've lost it and I need to order another one.  It's a black brace that goes over my heel and my ankle.  I called and left her a message that she can come by to pick up the Plantar Fascial Brace.  I informed her there will be an additional charge for the brace.

## 2014-03-12 DIAGNOSIS — M722 Plantar fascial fibromatosis: Secondary | ICD-10-CM

## 2014-06-22 ENCOUNTER — Encounter: Payer: Self-pay | Admitting: Podiatry

## 2015-01-09 ENCOUNTER — Emergency Department (INDEPENDENT_AMBULATORY_CARE_PROVIDER_SITE_OTHER): Payer: BC Managed Care – PPO

## 2015-01-09 ENCOUNTER — Encounter (HOSPITAL_COMMUNITY): Payer: Self-pay | Admitting: Emergency Medicine

## 2015-01-09 ENCOUNTER — Emergency Department (INDEPENDENT_AMBULATORY_CARE_PROVIDER_SITE_OTHER)
Admission: EM | Admit: 2015-01-09 | Discharge: 2015-01-09 | Disposition: A | Discharge status: Home / Self Care | Payer: BC Managed Care – PPO | Source: Home / Self Care

## 2015-01-09 DIAGNOSIS — S92912A Unspecified fracture of left toe(s), initial encounter for closed fracture: Secondary | ICD-10-CM | POA: Diagnosis not present

## 2015-01-09 MED ORDER — HYDROCODONE-ACETAMINOPHEN 5-325 MG PO TABS: 1.0000 | ORAL_TABLET | ORAL | Status: DC | PRN

## 2015-01-09 MED ORDER — IBUPROFEN 800 MG PO TABS
800.0000 mg | ORAL_TABLET | Freq: Once | ORAL | Status: AC
  Administered 2015-01-09: 800 mg via ORAL

## 2015-01-09 MED ORDER — IBUPROFEN 800 MG PO TABS
ORAL_TABLET | ORAL | Status: AC
  Filled 2015-01-09: qty 1

## 2015-01-09 NOTE — ED Notes (Addendum)
Pt comes in with right foot injury after storage unit door opened on toes Pain noted 3rd metatarsal with swelling Scraped area noted No treatment used  Up to date with Tetanus vaccine

## 2015-01-09 NOTE — Discharge Instructions (Signed)
Use the walking boot for the next 2 weeks until able to tolerate wearing a regular shoe.  If any numbness or signs of circulation compromise such as the toe appears blue and cold with decreased sensation, return here or to ED for evaluation.  Toe Fracture Your caregiver has diagnosed you as having a fractured toe. A toe fracture is a break in the bone of a toe. "Buddy taping" is a way of splinting your broken toe, by taping the broken toe to the toe next to it. This "buddy taping" will keep the injured toe from moving beyond normal range of motion. Buddy taping also helps the toe heal in a more normal alignment. It may take 6 to 8 weeks for the toe injury to heal. Parker your toes taped together for as long as directed by your caregiver or until you see a doctor for a follow-up examination. You can change the tape after bathing. Always use a small piece of gauze or cotton between the toes when taping them together. This will help the skin stay dry and prevent infection.  Apply ice to the injury for 15-20 minutes each hour while awake for the first 2 days. Put the ice in a plastic bag and place a towel between the bag of ice and your skin.  After the first 2 days, apply heat to the injured area. Use heat for the next 2 to 3 days. Place a heating pad on the foot or soak the foot in warm water as directed by your caregiver.  Keep your foot elevated as much as possible to lessen swelling.  Wear sturdy, supportive shoes. The shoes should not pinch the toes or fit tightly against the toes.  Your caregiver may prescribe a rigid shoe if your foot is very swollen.  Your may be given crutches if the pain is too great and it hurts too much to walk.  Only take over-the-counter or prescription medicines for pain, discomfort, or fever as directed by your caregiver.  If your caregiver has given you a follow-up appointment, it is very important to keep that appointment. Not keeping the  appointment could result in a chronic or permanent injury, pain, and disability. If there is any problem keeping the appointment, you must call back to this facility for assistance. SEEK MEDICAL CARE IF:   You have increased pain or swelling, not relieved with medications.  The pain does not get better after 1 week.  Your injured toe is cold when the others are warm. SEEK IMMEDIATE MEDICAL CARE IF:   The toe becomes cold, numb, or white.  The toe becomes hot (inflamed) and red. Document Released: 08/04/2000 Document Revised: 10/30/2011 Document Reviewed: 03/23/2008 [facility] Patient Information 2015 [place], [place]. This information is not intended to replace advice given to you by your health care provider. Make sure you discuss any questions you have with your health care provider.

## 2015-01-09 NOTE — ED Provider Notes (Signed)
CSN: [id number]     Arrival date & time 01/09/15  1701 History   None    Chief Complaint  Patient presents with  . Toe Injury   (Consider location/radiation/quality/duration/timing/severity/associated sxs/prior Treatment)  HPI   She is a 34 year old female presenting this evening with complaints of left toe/foot pain following injuring it with a heavy metal door at a storage space approximately one hour ago. Patient states her tetanus is current and up-to-date. States that she has a "scrape" on her third toe that she is "not concerned with". However, she states that she feels as though she dislocated her fourth toe and "pulled it back into place".  She is concerned that it's broken.  Past Medical History  Diagnosis Date  . Abnormal Pap smear   . 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 Menorrhagia   . Fibroids   . Dysmenorrhea   . Anemia   . Hypertension   . Asthma     SEASONAL  . Anxiety    Past Surgical History  Procedure Laterality Date  . No past surgeries    . Dilatation & curettage/hysteroscopy with trueclear N/A 06/27/2013    Procedure: DILATATION & CURETTAGE/HYSTEROSCOPY WITH TRUECLEAR;  Surgeon: Betsy Coder, MD;  Location: [facility];  Service: Gynecology;  Laterality: N/A;   Family History  Problem Relation Age of Onset  . Hypertension Mother   . Diabetes Mother   . Hypertension Father   . Hypertension Sister    History  Substance Use Topics  . Smoking status: Never Smoker   . Smokeless tobacco: Never Used  . Alcohol Use: Yes     Comment: social   OB History    Gravida Para Term Preterm AB TAB SAB Ectopic Multiple Living   2 1 1       1      Review of Systems  Constitutional: Negative.   HENT: Negative.   Eyes: Negative.   Respiratory: Negative.   Cardiovascular: Negative.   Endocrine: Negative.   Genitourinary: Negative.   Musculoskeletal: Negative.  Negative for joint swelling.       Pain with flexion and extension of L 4th toe.  Skin:  Positive for wound. Negative for color change, pallor and rash.  Allergic/Immunologic: Negative.   Neurological: Negative.  Negative for weakness and numbness.  Hematological: Negative.   Psychiatric/Behavioral: Negative.     Allergies  Penicillins  Home Medications   Prior to Admission medications   Medication Sig Start Date End Date Taking? Authorizing Provider  hydrochlorothiazide (HYDRODIURIL) 25 MG tablet Take 25 mg by mouth daily.   Yes Historical Provider, MD  sertraline (ZOLOFT) 25 MG tablet Take 25 mg by mouth daily.   Yes Historical Provider, MD  diclofenac (VOLTAREN) 75 MG EC tablet Take 1 tablet (75 mg total) by mouth 2 (two) times daily. 11/10/13   Wallene Huh, DPM  etonogestrel-ethinyl estradiol (NUVARING) 0.12-0.015 MG/24HR vaginal ring Place 1 each vaginally every 28 (twenty-eight) days. Insert vaginally and leave in place for 3 consecutive weeks, then remove for 1 week.    Historical Provider, MD  HYDROcodone-acetaminophen (NORCO/VICODIN) 5-325 MG per tablet Take 1-2 tablets by mouth every 4 (four) hours as needed. 01/09/15   Nehemiah Settle, NP  valACYclovir (VALTREX) 500 MG tablet  11/10/13   Historical Provider, MD   BP 123/84 mmHg  Pulse 79  Temp(Src) 98.2 F (36.8 C) (Oral)  SpO2 98%  LMP 12/23/2014   Physical Exam  Constitutional: She is oriented to  person, place, and time. She appears well-developed and well-nourished. No distress.  Cardiovascular: Normal rate, regular rhythm, normal heart sounds and intact distal pulses.  Exam reveals no gallop and no friction rub.   Pulmonary/Chest: Effort normal and breath sounds normal. No respiratory distress. She has no wheezes. She has no rales. She exhibits no tenderness.  Musculoskeletal: Normal range of motion. She exhibits tenderness. She exhibits no edema.       Left foot: There is tenderness, bony tenderness and laceration. There is normal range of motion, no swelling, normal capillary refill, no crepitus and no  deformity.       Feet:  Small laceration/abrasion noted on the tip of third toe left foot.  Neurological: She is alert and oriented to person, place, and time.  Skin: Skin is warm and dry. No rash noted. She is not diaphoretic. No erythema. No pallor.  Nursing note and vitals reviewed.   ED Course  Procedures (including critical care time) Labs Review Labs Reviewed - No data to display  Imaging Review Dg Toe 4th Left  01/09/2015   CLINICAL DATA:  Hit toe on door. Swollen, bruising. Injury 1 hour ago.  EXAM: LEFT FOURTH TOE  COMPARISON:  None.  FINDINGS: There is a fracture through the left fourth proximal phalanx. No displacement. No intra-articular extension. No additional acute bony abnormality.  IMPRESSION: Nondisplaced fracture through the left fourth proximal phalanx.   Electronically Signed   By: Rolm Baptise M.D.   On: 01/09/2015 18:07     MDM   1. Fracture, toe, left, closed, initial encounter    Meds ordered this encounter  Medications  . ibuprofen (ADVIL,MOTRIN) tablet 800 mg    Sig:   . HYDROcodone-acetaminophen (NORCO/VICODIN) 5-325 MG per tablet    Sig: Take 1-2 tablets by mouth every 4 (four) hours as needed.    Dispense:  30 tablet    Refill:  0    Fractured toe buddy taped to third toe of left foot and abrasion of 3rd toe was covered appropriately. 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 Patient advised signs and symptoms of neurovascular compromise and verbalizes understanding. Aleve for regular pain and Vicodin provided for acute discomfort.    Nehemiah Settle, NP 01/09/15 [phone]

## 2015-07-16 ENCOUNTER — Inpatient Hospital Stay (HOSPITAL_COMMUNITY)
Admission: AD | Admit: 2015-07-16 | Discharge: 2015-07-16 | Disposition: A | Discharge status: Home / Self Care | Payer: BC Managed Care – PPO | Source: Ambulatory Visit | Attending: Obstetrics & Gynecology | Admitting: Obstetrics & Gynecology

## 2015-07-16 ENCOUNTER — Encounter (HOSPITAL_COMMUNITY): Payer: Self-pay

## 2015-07-16 DIAGNOSIS — Z30431 Encounter for routine checking of intrauterine contraceptive device: Secondary | ICD-10-CM | POA: Diagnosis present

## 2015-07-16 DIAGNOSIS — I10 Essential (primary) hypertension: Secondary | ICD-10-CM | POA: Insufficient documentation

## 2015-07-16 DIAGNOSIS — Z880 Allergy status to penicillin: Secondary | ICD-10-CM | POA: Diagnosis not present

## 2015-07-16 DIAGNOSIS — T8332XA Displacement of intrauterine contraceptive device, initial encounter: Secondary | ICD-10-CM | POA: Diagnosis not present

## 2015-07-16 LAB — URINALYSIS, ROUTINE W REFLEX MICROSCOPIC
Bilirubin Urine: NEGATIVE
Glucose, UA: NEGATIVE mg/dL
Ketones, ur: NEGATIVE mg/dL
LEUKOCYTES UA: NEGATIVE
Nitrite: NEGATIVE
PH: 7 (ref 5.0–8.0)
PROTEIN: NEGATIVE mg/dL
SPECIFIC GRAVITY, URINE: 1.02 (ref 1.005–1.030)

## 2015-07-16 LAB — URINE MICROSCOPIC-ADD ON
BACTERIA UA: NONE SEEN
WBC, UA: NONE SEEN WBC/hpf (ref 0–5)

## 2015-07-16 LAB — POCT PREGNANCY, URINE: Preg Test, Ur: NEGATIVE

## 2015-07-16 NOTE — MAU Note (Signed)
Notified provider of patient's elevated BP of 151/103. Provider said she would see patient before she is discharged.

## 2015-07-16 NOTE — MAU Provider Note (Signed)
  History   64 yp, G2P1011 presents to the MAU with the complaints that her IUD  May have become dislodged.  Pt states  Had an IUD placed in September and has not had any issues with her IUD until today. She reports she  is on her period which started light and became heavy yesterday.  She felt pressure and poking in her vaginal today.  States she is able to touch the plastic part of her IUD.  Pt states she has not been sexually active.   Patient Active Problem List   Diagnosis Date Noted  . Fibroids 12/22/2011  . Anemia 12/22/2011  . Irregular bleeding 12/22/2011  . ASTHMA 02/25/2007    No chief complaint on file.  HPI  OB History    Gravida Para Term Preterm AB TAB SAB Ectopic Multiple Living   2 1 1  1 1    1       Past Medical History  Diagnosis Date  . Abnormal Pap smear   . 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 Menorrhagia   . Fibroids   . Dysmenorrhea   . Anemia   . Hypertension   . Asthma     SEASONAL  . Anxiety     Past Surgical History  Procedure Laterality Date  . No past surgeries    . Dilatation & curettage/hysteroscopy with trueclear N/A 06/27/2013    Procedure: DILATATION & CURETTAGE/HYSTEROSCOPY WITH TRUECLEAR;  Surgeon: Betsy Coder, MD;  Location: [facility];  Service: Gynecology;  Laterality: N/A;    Family History  Problem Relation Age of Onset  . Hypertension Mother   . Diabetes Mother   . Hypertension Father   . Hypertension Sister     Social History  Substance Use Topics  . Smoking status: Never Smoker   . Smokeless tobacco: Never Used  . Alcohol Use: Yes     Comment: social    Allergies:  Allergies  Allergen Reactions  . Penicillins Nausea And Vomiting    Prescriptions prior to admission  Medication Sig Dispense Refill Last Dose  . bismuth subsalicylate (PEPTO BISMOL) 262 MG/15ML suspension Take 30 mLs by mouth every 6 (six) hours as needed (For nausea).   07/16/2015 at Unknown time  . levonorgestrel (MIRENA) 20  MCG/24HR IUD 1 each by Intrauterine route once.   Continuous  . Multiple Vitamin (MULTIVITAMIN WITH MINERALS) TABS tablet Take 1 tablet by mouth daily.   07/15/2015 at Unknown time  . losartan-hydrochlorothiazide (HYZAAR) 50-12.5 MG tablet Take 1 tablet by mouth daily.  11 More than a month  . valACYclovir (VALTREX) 500 MG tablet Take 500 mg by mouth 2 (two) times daily as needed.    prn    ROS Physical Exam   Blood pressure 139/102, pulse 87, temperature 98.2 F (36.8 C), resp. rate 18, last menstrual period 07/16/2015.    Physical Exam   Vaginal exam revealed the IUD in the posterior vaginal vault surrounded by a large blood clot.  The IUD device with intact strings and clot were grasped and removed from the vagina.   ED Course  Assessment: Expelled IUD   Plan: Discharge home Discussed quick return of fertility Instructed to schedule a contraception visit at [place], [place], MSN 07/16/2015 6:00 PM

## 2015-07-16 NOTE — MAU Note (Signed)
Pt presents to MAU with complaints feeling like her Verdis Frederickson is coming out.

## 2015-07-16 NOTE — MAU Note (Signed)
Patient G2P1 c/o iud dislodgement and pain. Provider in delivery nurse answered phone communicated to provider

## 2015-11-15 ENCOUNTER — Other Ambulatory Visit: Payer: Self-pay | Admitting: Internal Medicine

## 2015-11-15 DIAGNOSIS — R1011 Right upper quadrant pain: Secondary | ICD-10-CM

## 2015-11-22 ENCOUNTER — Ambulatory Visit
Admission: RE | Admit: 2015-11-22 | Discharge: 2015-11-22 | Disposition: A | Discharge status: Home / Self Care | Payer: BC Managed Care – PPO | Source: Ambulatory Visit | Attending: Internal Medicine | Admitting: Internal Medicine

## 2015-11-22 DIAGNOSIS — R1011 Right upper quadrant pain: Secondary | ICD-10-CM

## 2016-05-17 ENCOUNTER — Other Ambulatory Visit: Payer: Self-pay
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
## 2016-07-12 ENCOUNTER — Encounter (HOSPITAL_COMMUNITY): Payer: Self-pay | Admitting: Psychiatry

## 2016-07-12 ENCOUNTER — Ambulatory Visit (INDEPENDENT_AMBULATORY_CARE_PROVIDER_SITE_OTHER): Payer: BC Managed Care – PPO | Admitting: Psychiatry

## 2016-07-12 VITALS — BP 126/78 | HR 83 | Ht 63.0 in | Wt 194.0 lb

## 2016-07-12 DIAGNOSIS — F411 Generalized anxiety disorder: Secondary | ICD-10-CM

## 2016-07-12 DIAGNOSIS — Z833 Family history of diabetes mellitus: Secondary | ICD-10-CM

## 2016-07-12 DIAGNOSIS — Z8249 Family history of ischemic heart disease and other diseases of the circulatory system: Secondary | ICD-10-CM | POA: Diagnosis not present

## 2016-07-12 MED ORDER — FLUOXETINE HCL (PMDD) 20 MG PO CAPS: 20.0000 mg | ORAL_CAPSULE | Freq: Every day | ORAL | 1 refills | Status: DC

## 2016-07-12 NOTE — Patient Instructions (Signed)
1. Start fluoxetine 20 mg daily 2. Return to clinic in one month

## 2016-09-03 NOTE — Progress Notes (Deleted)
BH MD/PA/NP OP Progress Note  09/03/2016 2:29 PM Kim Welch  MRN:  [id number]  Chief Complaint:  Subjective:  *** HPI: *** Visit Diagnosis: No diagnosis found.  Past Psychiatric History:  Outpatient: denies Psychiatry admission: denies  Previous suicide attempt: denies Past trials of medication: sertraline  Past Medical History:  Past Medical History:  Diagnosis Date  . Abnormal Pap smear   . Anemia   . Anxiety   . Asthma    SEASONAL  . Dysmenorrhea   . Fibroids   . 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 Hypertension   . Menorrhagia     Past Surgical History:  Procedure Laterality Date  . DILATATION & CURETTAGE/HYSTEROSCOPY WITH TRUECLEAR N/A 06/27/2013   Procedure: DILATATION & CURETTAGE/HYSTEROSCOPY WITH TRUECLEAR;  Surgeon: Betsy Coder, MD;  Location: [facility];  Service: Gynecology;  Laterality: N/A;  . NO PAST SURGERIES      Family Psychiatric History: ***  Family History:  Family History  Problem Relation Age of Onset  . Hypertension Mother   . Diabetes Mother   . Hypertension Father   . Hypertension Sister     Social History:  Social History   Social History  . Marital status: Single    Spouse name: N/A  . Number of children: N/A  . Years of education: N/A   Social History Main Topics  . Smoking status: Never Smoker  . Smokeless tobacco: Never Used  . Alcohol use Yes     Comment: social  . Drug use: No  . Sexual activity: Yes    Partners: Male    Birth control/ protection: Pill, None     Comment: alyacen 1/35   Other Topics Concern  . Not on file   Social History Narrative  . No narrative on file    Allergies:  Allergies  Allergen Reactions  . Penicillins Nausea And Vomiting    Metabolic Disorder Labs: No results found for: HGBA1C, MPG No results found for: PROLACTIN No results found for: CHOL, TRIG, HDL, CHOLHDL, VLDL, LDLCALC   Current Medications: Current Outpatient Prescriptions  Medication Sig  Dispense Refill  . Fluoxetine HCl, PMDD, 20 MG CAPS Take 1 capsule (20 mg total) by mouth daily. 30 each 1  . losartan-hydrochlorothiazide (HYZAAR) 50-12.5 MG tablet Take 12.5 tablets by mouth 1 day or 1 dose.    . Multiple Vitamin (MULTIVITAMIN WITH MINERALS) TABS tablet Take 1 tablet by mouth daily.     No current facility-administered medications for this visit.     Neurologic: Headache: No Seizure: No Paresthesias: No  Musculoskeletal: Strength & Muscle Tone: within normal limits Gait & Station: normal Patient leans: N/A  Psychiatric Specialty Exam: ROS  There were no vitals taken for this visit.There is no height or weight on file to calculate BMI.  General Appearance: Fairly Groomed  Eye Contact:  Good  Speech:  Clear and Coherent  Volume:  Normal  Mood:  {BHH MOOD:22306}  Affect:  {Affect (PAA):22687}  Thought Process:  Coherent and Goal Directed  Orientation:  Full (Time, Place, and Person)  Thought Content: Logical   Suicidal Thoughts:  {ST/HT (PAA):22692}  Homicidal Thoughts:  {ST/HT (PAA):22692}  Memory:  Immediate;   Good Recent;   Good Remote;   Good  Judgement:  {Judgement (PAA):22694}  Insight:  {Insight (PAA):22695}  Psychomotor Activity:  Normal  Concentration:  Concentration: Good and Attention Span: Good  Recall:  Good  Fund of Knowledge: Good  Language: Good  Akathisia:  No  Handed:  Right  AIMS (if indicated):  N/A  Assets:  Communication Skills Desire for Improvement  ADL's:  Intact  Cognition: WNL  Sleep:  ***   Assessment Kim Welch is a 36 year old female with anxiety, hypertension who presented for initial evaluation.   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 Will start fluoxetine at lower dose to target her mood. Side effects including nausea, vomiting and sexual dysfunction are discussed. Patient to  discontinue medication if she hopes for pregnancy (given risk would outweigh benefit). Noted that although she does have some features of ADHD, it is not significant to the point of starting medication and she appears to cope very well at school/home. She is informed that she can be referred for psychological testing if she wishes.   Plan 1. Start fluoxetine 20 mg daily 2. Return to clinic in one month  The patient demonstrates the following risk factors for suicide: Chronic risk factors for suicide include: psychiatric disorder of anxiety. Acute risk factors for suicide include: N/A. Protective factors for this patient include: responsibility to others (children, family), coping skills and hope for the future. Considering these factors, the overall suicide risk at this point appears to be low. Patient is appropriate for outpatient follow up.   Treatment Plan Summary: Plan as above  Norman Clay, MD 09/03/2016, 2:29 PM

## 2016-09-04 ENCOUNTER — Ambulatory Visit (HOSPITAL_COMMUNITY): Payer: Self-pay | Admitting: Psychiatry

## 2017-12-13 ENCOUNTER — Other Ambulatory Visit: Payer: Self-pay | Admitting: Obstetrics and Gynecology

## 2018-01-08 ENCOUNTER — Other Ambulatory Visit: Payer: Self-pay | Admitting: Obstetrics and Gynecology

## 2018-01-28 NOTE — Patient Instructions (Addendum)
Your procedure is scheduled on: Monday February 04, 2018 at 7:30 am  Enter through the Main Entrance of [facility] at: 6:00 am  Pick up the phone at the desk and dial [phone].  Call this number if you have problems the morning of surgery: [phone].  Remember: Do NOT eat food or drink any liquids after: Midnight on Sunday June 16  Take these medicines the morning of surgery with a SIP OF WATER: Zoloft. May take valtrex, clindamycin cream in needed  STOP ALL VITAMINS, SUPPLEMENTS, HERBAL MEDICATIONS NOW  DO NOT SMOKE DAY OF SURGERY  Do NOT wear jewelry (body piercing), metal hair clips/bobby pins, make-up, or nail polish. Do NOT wear lotions, powders, or perfumes.  You may wear deoderant. Do NOT shave for 48 hours prior to surgery. Do NOT bring valuables to the hospital. Contacts, dentures, or bridgework may not be worn into surgery.  Leave suitcase in car.  After surgery it may be brought to your room.    For patients admitted to the hospital, checkout time is 11:00 AM the day of discharge.

## 2018-01-30 ENCOUNTER — Other Ambulatory Visit: Payer: Self-pay

## 2018-01-30 ENCOUNTER — Encounter (HOSPITAL_COMMUNITY)
Admission: RE | Admit: 2018-01-30 | Discharge: 2018-01-30 | Disposition: A | Discharge status: Home / Self Care | Payer: BC Managed Care – PPO | Source: Ambulatory Visit | Attending: Obstetrics and Gynecology | Admitting: Obstetrics and Gynecology

## 2018-01-30 ENCOUNTER — Encounter (HOSPITAL_COMMUNITY): Payer: Self-pay

## 2018-01-30 DIAGNOSIS — Z01812 Encounter for preprocedural laboratory examination: Secondary | ICD-10-CM | POA: Insufficient documentation

## 2018-01-30 HISTORY — DX: Gastro-esophageal reflux disease without esophagitis: K21.9

## 2018-01-30 HISTORY — DX: Cardiac murmur, unspecified: R01.1

## 2018-01-30 LAB — TYPE AND SCREEN
ABO/RH(D): O POS
ANTIBODY SCREEN: NEGATIVE

## 2018-01-30 LAB — BASIC METABOLIC PANEL
ANION GAP: 9 (ref 5–15)
BUN: 11 mg/dL (ref 6–20)
CALCIUM: 8.6 mg/dL — AB (ref 8.9–10.3)
CO2: 25 mmol/L (ref 22–32)
Chloride: 101 mmol/L (ref 101–111)
Creatinine, Ser: 0.59 mg/dL (ref 0.44–1.00)
GLUCOSE: 93 mg/dL (ref 65–99)
Potassium: 3.9 mmol/L (ref 3.5–5.1)
Sodium: 135 mmol/L (ref 135–145)

## 2018-01-30 LAB — CBC
HCT: 38 % (ref 36.0–46.0)
HEMOGLOBIN: 12.2 g/dL (ref 12.0–15.0)
MCH: 25.5 pg — ABNORMAL LOW (ref 26.0–34.0)
MCHC: 32.1 g/dL (ref 30.0–36.0)
MCV: 79.3 fL (ref 78.0–100.0)
Platelets: 332 10*3/uL (ref 150–400)
RBC: 4.79 MIL/uL (ref 3.87–5.11)
RDW: 14.3 % (ref 11.5–15.5)
WBC: 5.5 10*3/uL (ref 4.0–10.5)

## 2018-01-30 LAB — ABO/RH: ABO/RH(D): O POS

## 2018-01-30 NOTE — Pre-Procedure Instructions (Signed)
Dr. Ambrose Pancoast  Viewed EKG and aware of history. No clearance needed

## 2018-02-02 ENCOUNTER — Encounter (HOSPITAL_COMMUNITY): Payer: Self-pay | Admitting: Anesthesiology

## 2018-02-02 NOTE — Anesthesia Preprocedure Evaluation (Addendum)
Anesthesia Evaluation  Patient identified by MRN, date of birth, ID band Patient awake    Reviewed: Allergy & Precautions, NPO status , Patient's Chart, lab work & pertinent test results  Airway Mallampati: III  TM Distance: >3 FB Neck ROM: Full    Dental  (+) Teeth Intact   Pulmonary asthma , former smoker,    Pulmonary exam normal breath sounds clear to auscultation       Cardiovascular hypertension, Pt. on medications Normal cardiovascular exam+ Valvular Problems/Murmurs  Rhythm:Regular Rate:Normal     Neuro/Psych PSYCHIATRIC DISORDERS Anxiety negative neurological ROS     GI/Hepatic Neg liver ROS, GERD  Medicated,  Endo/Other  Obesity  Renal/GU negative Renal ROS  negative genitourinary   Musculoskeletal negative musculoskeletal ROS (+)   Abdominal (+) + obese,   Peds  Hematology  (+) anemia ,   Anesthesia Other Findings   Reproductive/Obstetrics Menorrhagia Dysmenorrhea Uterine fibroids HSV                            Anesthesia Physical Anesthesia Plan  ASA: II  Anesthesia Plan: General   Post-op Pain Management:    Induction: Intravenous  PONV Risk Score and Plan: 4 or greater and Scopolamine patch - Pre-op, Midazolam, Dexamethasone, Ondansetron and Treatment may vary due to age or medical condition  Airway Management Planned: Oral ETT  Additional Equipment:   Intra-op Plan:   Post-operative Plan: Extubation in OR  Informed Consent: I have reviewed the patients History and Physical, chart, labs and discussed the procedure including the risks, benefits and alternatives for the proposed anesthesia with the patient or authorized representative who has indicated his/her understanding and acceptance.   Dental advisory given  Plan Discussed with: CRNA and Surgeon  Anesthesia Plan Comments:        Anesthesia Quick Evaluation

## 2018-02-03 NOTE — H&P (Signed)
Admission History and Physical Exam for a Gynecology Patient  Ms. TONIMARIE GRITZ is a 37 y.o. female, G2P1011, who presents for an abdominal myomectomy. She has been followed at the [facility] and Gy[facility]cology division of [facility]. An US showed a multifibroid uterus with the largest measuring 5.39 cm. She had a submucosal fibroid removed in 2014. She C/O menorrhagia. Her endometrial biopsy is normal. Her Pap is normal. GC and CHL are negative. She wants to maintain her childbearing potential.   OB History    Gravida  2   Para  1   Term  1   Preterm      AB  1   Living  1     SAB      TAB  1   Ectopic      Multiple      Live Births              Past Medical History:  Diagnosis Date  . Abnormal Pap smear   . Anemia   . Anxiety   . Asthma    SEASONAL  . Dysmenorrhea   . Fibroids   . GERD (gastroesophageal reflux disease)   . Heart murmur   . 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 Hypertension   . Menorrhagia     No medications prior to admission.    Past Surgical History:  Procedure Laterality Date  . DILATATION & CURETTAGE/HYSTEROSCOPY WITH TRUECLEAR N/A 06/27/2013   Procedure: DILATATION & CURETTAGE/HYSTEROSCOPY WITH TRUECLEAR;  Surgeon: Betsy Coder, MD;  Location: [facility];  Service: Gynecology;  Laterality: N/A;  . NO PAST SURGERIES      Allergies  Allergen Reactions  . Penicillins Nausea And Vomiting    Has patient had a PCN reaction causing immediate rash, facial/tongue/throat swelling, SOB or lightheadedness with hypotension: No Has patient had a PCN reaction causing severe rash involving mucus membranes or skin necrosis: No Has patient had a PCN reaction that required hospitalization: No Has patient had a PCN reaction occurring within the last 10 years: No If all of the above answers are "NO", then may proceed with Cephalosporin use.     Family History: family history includes Diabetes in her  mother; Hypertension in her father, mother, and sister.  Social History:  reports that she has quit smoking. Her smoking use included cigarettes. She has never used smokeless tobacco. She reports that she drinks alcohol. She reports that she does not use drugs.  Review of systems: See HPI.  Admission Physical Exam:    BMI is 36  There were no vitals taken for this visit.  HEENT:                 Within normal limits Chest:                   Clear Heart:                    Regular rate and rhythm Breasts:                No masses, skin changes, bleeding, or discharge present Abdomen:             Nontender, no masses Extremities:          Grossly normal Neurologic exam: Grossly normal  Pelvic exam:  External genitalia: normal general appearance Vaginal: normal without tenderness, induration or masses Cervix: normal appearance Adnexa: normal bimanual exam Uterus: 14  weeks size, irregular, firm  Assessment:  Menorrhagia Fibroids BMI is 36  Plan:  Abdominal myomectomy   Eli Hose 02/03/2018

## 2018-02-04 ENCOUNTER — Encounter (HOSPITAL_COMMUNITY): Payer: Self-pay | Admitting: Emergency Medicine

## 2018-02-04 ENCOUNTER — Inpatient Hospital Stay (HOSPITAL_COMMUNITY)
Admission: RE | Admit: 2018-02-04 | Discharge: 2018-02-06 | DRG: 743 | Disposition: A | Discharge status: Home / Self Care | Payer: BC Managed Care – PPO | Attending: Obstetrics and Gynecology | Admitting: Obstetrics and Gynecology

## 2018-02-04 ENCOUNTER — Other Ambulatory Visit: Payer: Self-pay

## 2018-02-04 ENCOUNTER — Inpatient Hospital Stay (HOSPITAL_COMMUNITY): Payer: BC Managed Care – PPO | Admitting: Anesthesiology

## 2018-02-04 ENCOUNTER — Encounter (HOSPITAL_COMMUNITY)
Admission: RE | Disposition: A | Discharge status: Home / Self Care | Payer: Self-pay | Source: Home / Self Care | Attending: Obstetrics and Gynecology

## 2018-02-04 DIAGNOSIS — I10 Essential (primary) hypertension: Secondary | ICD-10-CM | POA: Diagnosis present

## 2018-02-04 DIAGNOSIS — D251 Intramural leiomyoma of uterus: Secondary | ICD-10-CM | POA: Diagnosis present

## 2018-02-04 DIAGNOSIS — D250 Submucous leiomyoma of uterus: Principal | ICD-10-CM | POA: Diagnosis present

## 2018-02-04 DIAGNOSIS — Z87891 Personal history of nicotine dependence: Secondary | ICD-10-CM

## 2018-02-04 DIAGNOSIS — Z6836 Body mass index (BMI) 36.0-36.9, adult: Secondary | ICD-10-CM

## 2018-02-04 DIAGNOSIS — E669 Obesity, unspecified: Secondary | ICD-10-CM | POA: Diagnosis present

## 2018-02-04 DIAGNOSIS — Z880 Allergy status to penicillin: Secondary | ICD-10-CM

## 2018-02-04 DIAGNOSIS — D252 Subserosal leiomyoma of uterus: Secondary | ICD-10-CM | POA: Diagnosis present

## 2018-02-04 DIAGNOSIS — N926 Irregular menstruation, unspecified: Secondary | ICD-10-CM | POA: Diagnosis present

## 2018-02-04 DIAGNOSIS — D500 Iron deficiency anemia secondary to blood loss (chronic): Secondary | ICD-10-CM | POA: Diagnosis not present

## 2018-02-04 DIAGNOSIS — N800 Endometriosis of uterus: Secondary | ICD-10-CM | POA: Diagnosis present

## 2018-02-04 DIAGNOSIS — D219 Benign neoplasm of connective and other soft tissue, unspecified: Secondary | ICD-10-CM | POA: Diagnosis present

## 2018-02-04 DIAGNOSIS — R110 Nausea: Secondary | ICD-10-CM | POA: Diagnosis not present

## 2018-02-04 DIAGNOSIS — N946 Dysmenorrhea, unspecified: Secondary | ICD-10-CM | POA: Diagnosis present

## 2018-02-04 DIAGNOSIS — D259 Leiomyoma of uterus, unspecified: Secondary | ICD-10-CM | POA: Diagnosis present

## 2018-02-04 HISTORY — PX: MYOMECTOMY: SHX85

## 2018-02-04 LAB — PREGNANCY, URINE: Preg Test, Ur: NEGATIVE

## 2018-02-04 SURGERY — MYOMECTOMY, ABDOMINAL APPROACH
Anesthesia: General | Site: Abdomen

## 2018-02-04 MED ORDER — KETOROLAC TROMETHAMINE 30 MG/ML IJ SOLN
INTRAMUSCULAR | Status: DC | PRN
  Administered 2018-02-04: 15 mg via INTRAVENOUS

## 2018-02-04 MED ORDER — SODIUM CHLORIDE 0.9 % IJ SOLN
INTRAMUSCULAR | Status: AC
Start: 2018-02-04 — End: ?
  Filled 2018-02-04: qty 100

## 2018-02-04 MED ORDER — ACETAMINOPHEN 500 MG PO TABS
ORAL_TABLET | ORAL | Status: AC
  Administered 2018-02-04: 1000 mg via ORAL
  Filled 2018-02-04: qty 2

## 2018-02-04 MED ORDER — HYDROMORPHONE HCL 2 MG PO TABS
2.0000 mg | ORAL_TABLET | ORAL | Status: DC | PRN
Start: 2018-02-04 — End: 2018-02-06
  Administered 2018-02-04: 2 mg via ORAL
  Filled 2018-02-04 (×2): qty 1

## 2018-02-04 MED ORDER — METOCLOPRAMIDE HCL 5 MG/ML IJ SOLN: 10.0000 mg | Freq: Once | INTRAMUSCULAR | Status: DC | PRN

## 2018-02-04 MED ORDER — DEXAMETHASONE SODIUM PHOSPHATE 10 MG/ML IJ SOLN
INTRAMUSCULAR | Status: DC | PRN
  Administered 2018-02-04: 4 mg via INTRAVENOUS

## 2018-02-04 MED ORDER — PROPOFOL 10 MG/ML IV BOLUS
INTRAVENOUS | Status: AC
  Filled 2018-02-04: qty 20

## 2018-02-04 MED ORDER — KETOROLAC TROMETHAMINE 30 MG/ML IJ SOLN
30.0000 mg | Freq: Four times a day (QID) | INTRAMUSCULAR | Status: DC
  Administered 2018-02-04 – 2018-02-05 (×5): 30 mg via INTRAVENOUS
  Filled 2018-02-04 (×7): qty 1

## 2018-02-04 MED ORDER — HYDROMORPHONE HCL 1 MG/ML IJ SOLN
INTRAMUSCULAR | Status: AC
  Filled 2018-02-04: qty 0.5

## 2018-02-04 MED ORDER — FENTANYL CITRATE (PF) 250 MCG/5ML IJ SOLN
INTRAMUSCULAR | Status: AC
  Filled 2018-02-04: qty 5

## 2018-02-04 MED ORDER — HYDROMORPHONE HCL 1 MG/ML IJ SOLN
INTRAMUSCULAR | Status: AC
  Filled 2018-02-04: qty 1

## 2018-02-04 MED ORDER — ONDANSETRON HCL 4 MG/2ML IJ SOLN
INTRAMUSCULAR | Status: AC
  Filled 2018-02-04: qty 2

## 2018-02-04 MED ORDER — DEXAMETHASONE SODIUM PHOSPHATE 4 MG/ML IJ SOLN
INTRAMUSCULAR | Status: AC
  Filled 2018-02-04: qty 1

## 2018-02-04 MED ORDER — MEPERIDINE HCL 25 MG/ML IJ SOLN: 6.2500 mg | INTRAMUSCULAR | Status: DC | PRN

## 2018-02-04 MED ORDER — HYDROMORPHONE HCL 1 MG/ML IJ SOLN
0.5000 mg | INTRAMUSCULAR | Status: DC | PRN
  Administered 2018-02-04: 0.5 mg via INTRAVENOUS
  Filled 2018-02-04: qty 0.5

## 2018-02-04 MED ORDER — GENTAMICIN SULFATE 40 MG/ML IJ SOLN
INTRAVENOUS | Status: AC
  Administered 2018-02-04: 460 mg via INTRAVENOUS
  Filled 2018-02-04: qty 11.5

## 2018-02-04 MED ORDER — VASOPRESSIN 20 UNIT/ML IV SOLN
INTRAVENOUS | Status: AC
  Filled 2018-02-04: qty 1

## 2018-02-04 MED ORDER — EPHEDRINE SULFATE 50 MG/ML IJ SOLN
INTRAMUSCULAR | Status: DC | PRN
  Administered 2018-02-04: 10 mg via INTRAVENOUS

## 2018-02-04 MED ORDER — LACTATED RINGERS IV SOLN
INTRAVENOUS | Status: DC
  Administered 2018-02-04 (×3): via INTRAVENOUS

## 2018-02-04 MED ORDER — GABAPENTIN 300 MG PO CAPS
300.0000 mg | ORAL_CAPSULE | ORAL | Status: AC
  Administered 2018-02-04: 300 mg via ORAL

## 2018-02-04 MED ORDER — KETOROLAC TROMETHAMINE 30 MG/ML IJ SOLN
INTRAMUSCULAR | Status: AC
  Filled 2018-02-04: qty 1

## 2018-02-04 MED ORDER — LOSARTAN POTASSIUM 50 MG PO TABS
50.0000 mg | ORAL_TABLET | Freq: Every day | ORAL | Status: DC
  Administered 2018-02-04 – 2018-02-06 (×3): 50 mg via ORAL
  Filled 2018-02-04 (×3): qty 1

## 2018-02-04 MED ORDER — EPHEDRINE 5 MG/ML INJ
INTRAVENOUS | Status: AC
  Filled 2018-02-04: qty 10

## 2018-02-04 MED ORDER — PROPOFOL 10 MG/ML IV BOLUS
INTRAVENOUS | Status: DC | PRN
  Administered 2018-02-04: 200 mg via INTRAVENOUS

## 2018-02-04 MED ORDER — KETOROLAC TROMETHAMINE 15 MG/ML IJ SOLN
15.0000 mg | INTRAMUSCULAR | Status: AC
  Administered 2018-02-04: 15 mg via INTRAVENOUS
  Filled 2018-02-04: qty 1

## 2018-02-04 MED ORDER — LIDOCAINE HCL (PF) 1 % IJ SOLN
INTRAMUSCULAR | Status: AC
  Filled 2018-02-04: qty 5

## 2018-02-04 MED ORDER — HYDROMORPHONE HCL 1 MG/ML IJ SOLN
INTRAMUSCULAR | Status: DC | PRN
  Administered 2018-02-04: 1 mg via INTRAVENOUS

## 2018-02-04 MED ORDER — ROCURONIUM BROMIDE 100 MG/10ML IV SOLN
INTRAVENOUS | Status: DC | PRN
  Administered 2018-02-04: 10 mg via INTRAVENOUS
  Administered 2018-02-04: 50 mg via INTRAVENOUS
  Administered 2018-02-04: 10 mg via INTRAVENOUS

## 2018-02-04 MED ORDER — LIDOCAINE HCL (CARDIAC) PF 100 MG/5ML IV SOSY
PREFILLED_SYRINGE | INTRAVENOUS | Status: DC | PRN
  Administered 2018-02-04: 50 mg via INTRAVENOUS

## 2018-02-04 MED ORDER — MIDAZOLAM HCL 2 MG/2ML IJ SOLN
INTRAMUSCULAR | Status: DC | PRN
  Administered 2018-02-04: 2 mg via INTRAVENOUS

## 2018-02-04 MED ORDER — IBUPROFEN 800 MG PO TABS
800.0000 mg | ORAL_TABLET | Freq: Three times a day (TID) | ORAL | Status: DC | PRN
  Administered 2018-02-06: 800 mg via ORAL
  Filled 2018-02-04: qty 1

## 2018-02-04 MED ORDER — SIMETHICONE 80 MG PO CHEW
80.0000 mg | CHEWABLE_TABLET | Freq: Four times a day (QID) | ORAL | Status: DC | PRN
  Administered 2018-02-05 – 2018-02-06 (×2): 80 mg via ORAL
  Filled 2018-02-04 (×2): qty 1

## 2018-02-04 MED ORDER — SERTRALINE HCL 50 MG PO TABS
50.0000 mg | ORAL_TABLET | Freq: Every day | ORAL | Status: DC
  Administered 2018-02-04 – 2018-02-06 (×3): 50 mg via ORAL
  Filled 2018-02-04 (×3): qty 1

## 2018-02-04 MED ORDER — VASOPRESSIN 20 UNIT/ML IV SOLN
INTRAVENOUS | Status: DC | PRN
  Administered 2018-02-04: 20 mL via INTRAMUSCULAR

## 2018-02-04 MED ORDER — BUPIVACAINE-EPINEPHRINE (PF) 0.5% -1:200000 IJ SOLN
INTRAMUSCULAR | Status: AC
  Filled 2018-02-04: qty 30

## 2018-02-04 MED ORDER — SUGAMMADEX SODIUM 200 MG/2ML IV SOLN
INTRAVENOUS | Status: DC | PRN
  Administered 2018-02-04: 200 mg via INTRAVENOUS

## 2018-02-04 MED ORDER — KETOROLAC TROMETHAMINE 30 MG/ML IJ SOLN
30.0000 mg | Freq: Four times a day (QID) | INTRAMUSCULAR | Status: DC
  Administered 2018-02-05: 30 mg via INTRAMUSCULAR

## 2018-02-04 MED ORDER — MIDAZOLAM HCL 2 MG/2ML IJ SOLN
INTRAMUSCULAR | Status: AC
  Filled 2018-02-04: qty 2

## 2018-02-04 MED ORDER — DEXTROSE IN LACTATED RINGERS 5 % IV SOLN
INTRAVENOUS | Status: DC
  Administered 2018-02-04 (×2): via INTRAVENOUS

## 2018-02-04 MED ORDER — ONDANSETRON HCL 4 MG/2ML IJ SOLN
INTRAMUSCULAR | Status: DC | PRN
  Administered 2018-02-04: 4 mg via INTRAVENOUS

## 2018-02-04 MED ORDER — ACETAMINOPHEN 500 MG PO TABS
1000.0000 mg | ORAL_TABLET | ORAL | Status: AC
  Administered 2018-02-04: 1000 mg via ORAL

## 2018-02-04 MED ORDER — BUPIVACAINE-EPINEPHRINE 0.5% -1:200000 IJ SOLN
INTRAMUSCULAR | Status: DC | PRN
  Administered 2018-02-04: 17 mL
  Administered 2018-02-04: 20 mL

## 2018-02-04 MED ORDER — FENTANYL CITRATE (PF) 100 MCG/2ML IJ SOLN
INTRAMUSCULAR | Status: DC | PRN
  Administered 2018-02-04 (×5): 50 ug via INTRAVENOUS

## 2018-02-04 MED ORDER — GABAPENTIN 300 MG PO CAPS
ORAL_CAPSULE | ORAL | Status: AC
  Administered 2018-02-04: 300 mg via ORAL
  Filled 2018-02-04: qty 1

## 2018-02-04 MED ORDER — SCOPOLAMINE 1 MG/3DAYS TD PT72
1.0000 | MEDICATED_PATCH | TRANSDERMAL | Status: DC
  Administered 2018-02-04: 1.5 mg via TRANSDERMAL

## 2018-02-04 MED ORDER — SUGAMMADEX SODIUM 200 MG/2ML IV SOLN
INTRAVENOUS | Status: AC
  Filled 2018-02-04: qty 2

## 2018-02-04 MED ORDER — HYDROMORPHONE HCL 1 MG/ML IJ SOLN
0.2500 mg | INTRAMUSCULAR | Status: DC | PRN
  Administered 2018-02-04: 0.5 mg via INTRAVENOUS

## 2018-02-04 MED ORDER — LOSARTAN POTASSIUM-HCTZ 50-12.5 MG PO TABS: 1.0000 | ORAL_TABLET | Freq: Every day | ORAL | Status: DC

## 2018-02-04 MED ORDER — ONDANSETRON HCL 4 MG PO TABS: 4.0000 mg | ORAL_TABLET | Freq: Four times a day (QID) | ORAL | Status: DC | PRN

## 2018-02-04 MED ORDER — ONDANSETRON HCL 4 MG/2ML IJ SOLN
4.0000 mg | Freq: Four times a day (QID) | INTRAMUSCULAR | Status: DC | PRN
  Administered 2018-02-04 (×2): 4 mg via INTRAVENOUS
  Filled 2018-02-04 (×2): qty 2

## 2018-02-04 MED ORDER — HYDROCHLOROTHIAZIDE 12.5 MG PO CAPS
12.5000 mg | ORAL_CAPSULE | Freq: Every day | ORAL | Status: DC
  Administered 2018-02-04 – 2018-02-06 (×3): 12.5 mg via ORAL
  Filled 2018-02-04 (×3): qty 1

## 2018-02-04 MED ORDER — SCOPOLAMINE 1 MG/3DAYS TD PT72
MEDICATED_PATCH | TRANSDERMAL | Status: AC
  Administered 2018-02-04: 1.5 mg via TRANSDERMAL
  Filled 2018-02-04: qty 1

## 2018-02-04 SURGICAL SUPPLY — 52 items
BARRIER ADHS 3X4 INTERCEED (GAUZE/BANDAGES/DRESSINGS) ×4 IMPLANT
BRR ADH 4X3 ABS CNTRL BYND (GAUZE/BANDAGES/DRESSINGS) ×2
CANISTER SUCT 3000ML PPV (MISCELLANEOUS) ×3 IMPLANT
CELLS DAT CNTRL 66122 CELL SVR (MISCELLANEOUS) ×1 IMPLANT
CONT PATH 16OZ SNAP LID 3702 (MISCELLANEOUS) ×3 IMPLANT
DECANTER SPIKE VIAL GLASS SM (MISCELLANEOUS) ×3 IMPLANT
DRAIN JACKSON PRT FLT 7MM (DRAIN) IMPLANT
DRAPE CESAREAN BIRTH W POUCH (DRAPES) ×3 IMPLANT
DRSG OPSITE POSTOP 4X10 (GAUZE/BANDAGES/DRESSINGS) ×2 IMPLANT
DURAPREP 26ML APPLICATOR (WOUND CARE) ×3 IMPLANT
ELECT CAUTERY BLADE 6.4 (BLADE) IMPLANT
EVACUATOR SILICONE 100CC (DRAIN) IMPLANT
GAUZE SPONGE 4X4 16PLY XRAY LF (GAUZE/BANDAGES/DRESSINGS) ×4 IMPLANT
GLOVE BIOGEL PI IND STRL 7.0 (GLOVE) ×1 IMPLANT
GLOVE BIOGEL PI IND STRL 8.5 (GLOVE) ×1 IMPLANT
GLOVE BIOGEL PI INDICATOR 7.0 (GLOVE) ×2
GLOVE BIOGEL PI INDICATOR 8.5 (GLOVE) ×2
GLOVE ECLIPSE 8.0 STRL XLNG CF (GLOVE) ×6 IMPLANT
GOWN STRL REUS W/ TWL XL LVL3 (GOWN DISPOSABLE) ×1 IMPLANT
GOWN STRL REUS W/TWL LRG LVL3 (GOWN DISPOSABLE) ×6 IMPLANT
GOWN STRL REUS W/TWL XL LVL3 (GOWN DISPOSABLE) ×3
NEEDLE HYPO 22GX1.5 SAFETY (NEEDLE) ×3 IMPLANT
NS IRRIG 1000ML POUR BTL (IV SOLUTION) ×5 IMPLANT
PACK ABDOMINAL GYN (CUSTOM PROCEDURE TRAY) ×3 IMPLANT
PAD ABD 8X7 1/2 STERILE (GAUZE/BANDAGES/DRESSINGS) ×2 IMPLANT
PAD OB MATERNITY 4.3X12.25 (PERSONAL CARE ITEMS) ×3 IMPLANT
PENCIL SMOKE EVAC W/HOLSTER (ELECTROSURGICAL) ×3 IMPLANT
PROTECTOR NERVE ULNAR (MISCELLANEOUS) ×3 IMPLANT
RETRACTOR WND ALEXIS 18 MED (MISCELLANEOUS) IMPLANT
RINGERS IRRIG 1000ML POUR BTL (IV SOLUTION) ×2 IMPLANT
RTRCTR WOUND ALEXIS 18CM MED (MISCELLANEOUS) ×3
STAPLER VISISTAT 35W (STAPLE) ×3 IMPLANT
SUT MNCRL AB 3-0 PS2 27 (SUTURE) IMPLANT
SUT PDS AB 0 CT 36 (SUTURE) IMPLANT
SUT VIC AB 0 CT1 18XCR BRD8 (SUTURE) ×3 IMPLANT
SUT VIC AB 0 CT1 27 (SUTURE) ×6
SUT VIC AB 0 CT1 27XBRD ANBCTR (SUTURE) ×2 IMPLANT
SUT VIC AB 0 CT1 36 (SUTURE) ×2 IMPLANT
SUT VIC AB 0 CT1 8-18 (SUTURE) ×12
SUT VIC AB 2-0 CT1 27 (SUTURE)
SUT VIC AB 2-0 CT1 TAPERPNT 27 (SUTURE) IMPLANT
SUT VIC AB 3-0 CT1 27 (SUTURE) ×3
SUT VIC AB 3-0 CT1 TAPERPNT 27 (SUTURE) ×1 IMPLANT
SUT VIC AB 3-0 SH 27 (SUTURE) ×18
SUT VIC AB 3-0 SH 27X BRD (SUTURE) ×5 IMPLANT
SUT VIC AB 4-0 SH 27 (SUTURE)
SUT VIC AB 4-0 SH 27XANBCTRL (SUTURE) IMPLANT
SUT VICRYL 0 TIES 12 18 (SUTURE) ×3 IMPLANT
SYR CONTROL 10ML LL (SYRINGE) ×3 IMPLANT
TAPE HYPAFIX 4 X10 (GAUZE/BANDAGES/DRESSINGS) ×2 IMPLANT
TOWEL OR 17X24 6PK STRL BLUE (TOWEL DISPOSABLE) ×6 IMPLANT
TRAY FOLEY W/BAG SLVR 14FR (SET/KITS/TRAYS/PACK) ×3 IMPLANT

## 2018-02-04 NOTE — Anesthesia Postprocedure Evaluation (Signed)
Anesthesia Post Note  Patient: Janella T Brodman  Procedure(s) Performed: MULTIPLE ABDOMINAL MYOMECTOMY (N/A Abdomen)     Patient location during evaluation: [facility] Anesthesia Type: General Level of consciousness: awake, awake and alert and oriented Pain management: pain level controlled Vital Signs Assessment: post-procedure vital signs reviewed and stable Respiratory status: spontaneous breathing, nonlabored ventilation and respiratory function stable Cardiovascular status: stable Postop Assessment: no headache, no backache, patient able to bend at knees, no apparent nausea or vomiting and adequate PO intake Anesthetic complications: no    Last Vitals:  Vitals:   02/04/18 1310 02/04/18 1550  BP: 109/66 116/76  Pulse: 85 71  Resp: 18 18  Temp: (!) 36.4 C 36.5 C  SpO2: 95% 98%    Last Pain:  Vitals:   02/04/18 1615  TempSrc:   PainSc: 3    Pain Goal: Patients Stated Pain Goal: 4 (02/04/18 1145)               Francina Beery

## 2018-02-04 NOTE — Anesthesia Procedure Notes (Signed)
Procedure Name: Intubation Date/Time: 02/04/2018 7:31 AM Performed by: Bufford Spikes, CRNA Pre-anesthesia Checklist: Patient identified, Emergency Drugs available, Suction available and Patient being monitored Patient Re-evaluated:Patient Re-evaluated prior to induction Oxygen Delivery Method: Circle system utilized Preoxygenation: Pre-oxygenation with 100% oxygen Induction Type: IV induction Ventilation: Mask ventilation without difficulty Laryngoscope Size: Miller and 2 Grade View: Grade I Tube type: Oral Tube size: 7.0 mm Number of attempts: 1 Airway Equipment and Method: Stylet and Oral airway Placement Confirmation: ETT inserted through vocal cords under direct vision,  positive ETCO2 and breath sounds checked- equal and bilateral Secured at: 21 cm Tube secured with: Tape Dental Injury: Teeth and Oropharynx as per pre-operative assessment

## 2018-02-04 NOTE — Op Note (Signed)
OPERATIVE NOTE  Kim Welch  DOB:    [birth year]  MRN:    [id number]  CSN:    [id number]  Date of Surgery:  02/04/2018  Preoperative Diagnosis:  Fibroid uterus Irregular cycles Dysmenorrhea BMI equals 36  Postoperative Diagnosis:  Fibroid uterus Irregular cycles Dysmenorrhea BMI equals 36 Probable adenomyosis  Procedure:  Multiple abdominal myomectomy  Surgeon:  Gildardo Cranker, M.D.  Assistant:  Kendall Flack, MD  Anesthetic:  General  Disposition:  The patient presents with the above diagnosis. She understands the indications for surgical procedure as well as her alternative treatment options. She accepts the risk of, but not limited to, anesthetic complications, bleeding, infections, and possible damage to the surrounding organs.  Findings:  The patient had a 16-week size multi-fibroid uterus.  A total of 28 fibroids were removed with the largest fibroid measuring approximately 7 cm in size.  The weight of the fibroids was 495 g.  The fallopian tubes and ovaries appeared normal.  Submucosal, intramural, and subserosal fibroids were noted.  The patient will require a cesarean delivery if and when she becomes pregnant.  Procedure:  The patient was taken to the operating room where a general anesthetic was given. A timeout was performed confirming the appropriate procedure. Foley catheter was placed in the bladder. Her perineum and vagina were prepped with Betadine. The patient's abdomen was prepped with DuraPrep.  The patient was sterilely draped. The patient's lower abdomen was injected with 10 cc of half percent Marcaine with epinephrine. A low transverse incision was made in the lower abdomen and carried sharply through the subcutaneous tissue, the fascia, and the anterior peritoneum. The pelvis was explored with findings as mentioned above. The uterus was elevated into the operative field.  The surface of the uterus was injected with a diluted  solution of Pitressin and saline just above the largest fibroid. Pictures were taken. The fibroid was removed from the myometrium using a combination of sharp and blunt dissection. The defect in the uterus was closed using interrupted sutures of 0 Vicryl and the myometrium and then a baseball suture of 3-0 Vicryl on the surface of the uterus. Care was taken not to damage the fallopian tubes.  This procedure was repeated multiple times until all 28 fibroids were removed. The pelvis was irrigated. Hemostasis was confirmed. Interceed was placed over the uterine incisions. All instruments and packs were removed from the abdominal cavity. The anterior peritoneum and the abdominal musculature were reapproximated in the midline using figure-of-eight sutures. The abdominal musculature and the fascia were irrigated. Hemostasis was adequate. The fascia was closed using a running suture followed by several interrupted sutures. The subcutaneous layer was closed using interrupted sutures. The skin was reapproximated using a subcuticular suture of 3-0 Monocryl. 0 Vicryl is the suture material used except where otherwise mentioned.  Sponge, needle, and instrument counts were correct on 2 occasions. The estimated blood loss for the procedure was 275 cc. The patient tolerated her procedure well. She was awakened from her anesthetic without difficulty. She was transported to the recovery room in stable condition. She was noted to drain clear yellow urine. Pathology specimens included: Multiple fibroids weighing approximately 495 g.   Gildardo Cranker, M.D.  02/04/2018

## 2018-02-04 NOTE — Anesthesia Postprocedure Evaluation (Signed)
Anesthesia Post Note  Patient: Kim Welch  Procedure(s) Performed: MULTIPLE ABDOMINAL MYOMECTOMY (N/A Abdomen)     Patient location during evaluation: PACU Anesthesia Type: General Level of consciousness: awake and alert Pain management: pain level controlled Vital Signs Assessment: post-procedure vital signs reviewed and stable Respiratory status: spontaneous breathing, nonlabored ventilation and respiratory function stable Cardiovascular status: blood pressure returned to baseline and stable Postop Assessment: no apparent nausea or vomiting Anesthetic complications: no    Last Vitals:  Vitals:   02/04/18 1130 02/04/18 1145  BP: 121/78 120/79  Pulse: 99 96  Resp: 13 14  Temp:    SpO2: 93% 94%    Last Pain:  Vitals:   02/04/18 1145  TempSrc:   PainSc: 3    Pain Goal: Patients Stated Pain Goal: 4 (02/04/18 1145)               Yarenis Cerino A.

## 2018-02-04 NOTE — Progress Notes (Signed)
Subjective: Patient reports incisional pain and tolerating PO.    Objective: I have reviewed patient's vital signs, intake and output and medications.  General: no distress Resp: clear to auscultation bilaterally Cardio: regular rate and rhythm, S1, S2 normal, no murmur, click, rub or gallop GI: soft, non-tender; bowel sounds normal; no masses,  no organomegaly Extremities: extremities normal, atraumatic, no cyanosis or edema Vaginal Bleeding: minimal and As expected for menstrual cycle.   Assessment/Plan:  Doing well following abdominal myomectomy. Routine postoperative care.   LOS: 0 days    Eli Hose 02/04/2018, 6:38 PM

## 2018-02-04 NOTE — OR Nursing (Signed)
Per Dr. Raphael Gibney, family updated on surgery by phone by C.Erling Cruz

## 2018-02-04 NOTE — Progress Notes (Signed)
The patient was interviewed and examined today.  The previously documented history and physical examination was reviewed. There are no changes. The operative procedure was reviewed. The risks and benefits were outlined again. The specific risks include, but are not limited to, anesthetic complications, bleeding, infections, and possible damage to the surrounding organs. The patient's questions were answered.  We are ready to proceed as outlined. The likelihood of the patient achieving the goals of this procedure is very likely.   BP (!) 133/94   Pulse 85   Temp 98.3 F (36.8 C) (Oral)   Resp 16   Ht 5\' 3"  (1.6 m)   Wt 92.1 kg (203 lb)   LMP 02/04/2018   SpO2 99%   BMI 35.96 kg/m   CBC    Component Value Date/Time   WBC 5.5 01/30/2018 0905   RBC 4.79 01/30/2018 0905   HGB 12.2 01/30/2018 0905   HCT 38.0 01/30/2018 0905   PLT 332 01/30/2018 0905   MCV 79.3 01/30/2018 0905   MCH 25.5 (L) 01/30/2018 0905   MCHC 32.1 01/30/2018 0905   RDW 14.3 01/30/2018 0905   LYMPHSABS 0.6 (L) 02/01/2007 1430   MONOABS 1.1 (H) 02/01/2007 1430   EOSABS 0.1 02/01/2007 1430   BASOSABS 0.0 02/01/2007 1430     Gildardo Cranker, M.D.

## 2018-02-04 NOTE — Transfer of Care (Signed)
Immediate Anesthesia Transfer of Care Note  Patient: Kim Welch  Procedure(s) Performed: MULTIPLE ABDOMINAL MYOMECTOMY (N/A Abdomen)  Patient Location: PACU  Anesthesia Type:General  Level of Consciousness: awake, alert  and oriented  Airway & Oxygen Therapy: Patient Spontanous Breathing and Patient connected to nasal cannula oxygen  Post-op Assessment: Report given to RN, Post -op Vital signs reviewed and stable and Patient moving all extremities X 4  Post vital signs: Reviewed and stable  Last Vitals:  Vitals Value Taken Time  BP 123/66 02/04/2018 11:00 AM  Temp    Pulse 106 02/04/2018 11:12 AM  Resp 14 02/04/2018 11:12 AM  SpO2 98 % 02/04/2018 11:12 AM  Vitals shown include unvalidated device data.  Last Pain:  Vitals:   02/04/18 0620  TempSrc: Oral  PainSc: 2       Patients Stated Pain Goal: 4 (16/01/09 3235)  Complications: No apparent anesthesia complications

## 2018-02-04 NOTE — Addendum Note (Signed)
Addendum  created 02/04/18 1717 by Syd Manges, Waynard Reeds, CRNA   Sign clinical note

## 2018-02-05 ENCOUNTER — Encounter (HOSPITAL_COMMUNITY): Payer: Self-pay | Admitting: Obstetrics and Gynecology

## 2018-02-05 LAB — CBC
HCT: 31.2 % — ABNORMAL LOW (ref 36.0–46.0)
HEMOGLOBIN: 10 g/dL — AB (ref 12.0–15.0)
MCH: 25.1 pg — ABNORMAL LOW (ref 26.0–34.0)
MCHC: 32.1 g/dL (ref 30.0–36.0)
MCV: 78.2 fL (ref 78.0–100.0)
PLATELETS: 247 10*3/uL (ref 150–400)
RBC: 3.99 MIL/uL (ref 3.87–5.11)
RDW: 14.3 % (ref 11.5–15.5)
WBC: 13.3 10*3/uL — AB (ref 4.0–10.5)

## 2018-02-05 MED ORDER — HYDROMORPHONE HCL 2 MG PO TABS: 2.0000 mg | ORAL_TABLET | ORAL | 0 refills | Status: DC | PRN

## 2018-02-05 MED ORDER — ONDANSETRON HCL 4 MG PO TABS: 4.0000 mg | ORAL_TABLET | Freq: Three times a day (TID) | ORAL | 0 refills | Status: DC | PRN

## 2018-02-05 MED ORDER — IBUPROFEN 800 MG PO TABS: 800.0000 mg | ORAL_TABLET | Freq: Three times a day (TID) | ORAL | 1 refills | Status: DC | PRN

## 2018-02-05 NOTE — Discharge Instructions (Signed)
Call [facility] @ [phone] if:  You have a temperature greater than or equal to 100.4 degrees Farenheit orally You have pain that is not made better by the pain medication given and taken as directed You have excessive bleeding or problems urinating  Take Colace (Docusate Sodium/Stool Softener) 100 mg 2-3 times daily while taking narcotic pain medicine to avoid constipation or until bowel movements are regular. Take your Ibuprofen with food as directed for 5 days then as needed for pain Take an over the counter iron supplement of your choice twice a day for 3 months  You may drive after 2  weeks You may walk up steps  You may shower  You may resume a regular diet  Keep incisions clean and dry.  If you have a honeycomb dressing,  remove it on February 12, 2018 Do not lift over 15 pounds for 6 weeks Avoid anything in vagina for 6 weeks (or until after your post-operative visit)

## 2018-02-05 NOTE — Progress Notes (Signed)
Kim Welch is a18 y.o.  [id number]  Post Op Date # 1:  Abdominal Myomectomy  Subjective: Patient is Doing well postoperatively. Patient has Pain is controlled with current analgesics. Medications being used: prescription NSAID's including Ibuprofen and narcotic analgesics including Dilaudid. Ambulating without difficulty, tolerating a regular diet but has not voided yet.   Objective: Vital signs in last 24 hours: Temp:  [97.4 F (36.3 C)-99.1 F (37.3 C)] 99.1 F (37.3 C) (06/18 0518) Pulse Rate:  [67-105] 71 (06/18 0518) Resp:  [12-18] 17 (06/18 0518) BP: (102-131)/(66-81) 116/67 (06/18 0518) SpO2:  [93 %-100 %] 97 % (06/18 0518)  Intake/Output from previous day: 06/17 0701 - 06/18 0700 In: 3110 [P.O.:710; I.V.:2400] Out: 2975 [Urine:2700] Intake/Output this shift: No intake/output data recorded. Recent Labs  Lab 01/30/18 0905 02/05/18 0552  WBC 5.5 13.3*  HGB 12.2 10.0*  HCT 38.0 31.2*  PLT 332 247     Recent Labs  Lab 01/30/18 0905  NA 135  K 3.9  CL 101  CO2 25  BUN 11  CREATININE 0.59  CALCIUM 8.6*  GLUCOSE 93    EXAM: General: alert, cooperative and no distress Resp: clear to auscultation bilaterally Cardio: regular rate and rhythm, S1, S2 normal, no murmur, click, rub or gallop GI: bowel sounds present, pressure dressing is clean/dry/intact. Extremities: Homans sign is negative, no sign of DVT and no calf tenderness.   Assessment: s/p Procedure(s): MULTIPLE ABDOMINAL MYOMECTOMY: stable, progressing well, tolerating diet and anemia  Plan: Awaiting to void  Routine care  LOS: 1 day    Earnstine Regal, PA-C 02/05/2018 7:17 AM

## 2018-02-05 NOTE — Progress Notes (Signed)
Subjective: Patient reports nausea, incisional pain and tolerating PO.    Objective: I have reviewed patient's vital signs and labs.  General: no distress Resp: clear to auscultation bilaterally Cardio: regular rate and rhythm, S1, S2 normal, no murmur, click, rub or gallop GI: soft, non-tender; bowel sounds normal; no masses,  no organomegaly Extremities: extremities normal, atraumatic, no cyanosis or edema Dressing clean and dry  CBC    Component Value Date/Time   WBC 13.3 (H) 02/05/2018 0552   RBC 3.99 02/05/2018 0552   HGB 10.0 (L) 02/05/2018 0552   HCT 31.2 (L) 02/05/2018 0552   PLT 247 02/05/2018 0552   MCV 78.2 02/05/2018 0552   MCH 25.1 (L) 02/05/2018 0552   MCHC 32.1 02/05/2018 0552   RDW 14.3 02/05/2018 0552   LYMPHSABS 0.6 (L) 02/01/2007 1430   MONOABS 1.1 (H) 02/01/2007 1430   EOSABS 0.1 02/01/2007 1430   BASOSABS 0.0 02/01/2007 1430     Assessment/Plan:  Doing well on postoperative day #1. Anemia secondary to blood loss during myomectomy.  The patient is not orthostatic. Nausea associated with surgery.  It is improved since last night.  The patient will advance her diet and advance ambulation.  If her pain is adequately controlled, then she will be discharged home later today.  Otherwise the patient will be discharged tomorrow.  I gave the patient her prescriptions and discharge instructions.  She will return to see me in 4 to 6 weeks or as needed.  Dr. Kendall Flack will attend the patient for the remainder of his hospital stay [phone]).   LOS: 1 day    Eli Hose 02/05/2018, 6:27 AM

## 2018-02-06 NOTE — Progress Notes (Signed)
Kim Welch is a48 y.o.  [id number]  Post Op Date # 2 Abdominal Myomectomy  Subjective: Patient is Doing well postoperatively. Patient has Pain is controlled with current analgesics. Medications being used: prescription NSAID's including Ibuprofen and narcotic analgesics including Percocet.. Tolerating a regular diet, voiding without difficulty, ambulating in the halls and states she's ready to go home.   Objective: Vital signs in last 24 hours: Temp:  [98.1 F (36.7 C)-99.5 F (37.5 C)] 98.6 F (37 C) (06/19 0404) Pulse Rate:  [69-91] 69 (06/19 0404) Resp:  [16-18] 16 (06/19 0404) BP: (102-125)/(60-76) 116/65 (06/19 0404) SpO2:  [98 %-100 %] 99 % (06/19 0404)  Intake/Output from previous day: 06/18 0701 - 06/19 0700 In: -  Out: 700 [Urine:700] Intake/Output this shift: No intake/output data recorded. Recent Labs  Lab 01/30/18 0905 02/05/18 0552  WBC 5.5 13.3*  HGB 12.2 10.0*  HCT 38.0 31.2*  PLT 332 247     Recent Labs  Lab 01/30/18 0905  NA 135  K 3.9  CL 101  CO2 25  BUN 11  CREATININE 0.59  CALCIUM 8.6*  GLUCOSE 93    EXAM: General: alert, cooperative and no distress Resp: clear to auscultation bilaterally Cardio: regular rate and rhythm, S1, S2 normal, no murmur, click, rub or gallop GI: bowel sounds present;  dressing is clean/dry/intact Extremities: Homans sign is negative, no sign of DVT and no calf tenderness.   Assessment: s/p Procedure(s): MULTIPLE ABDOMINAL MYOMECTOMY: stable, progressing well, tolerating diet and anemia  Plan: Discharge home  LOS: 2 days    Earnstine Regal, PA-C 02/06/2018 7:07 AM

## 2018-02-06 NOTE — Progress Notes (Signed)
Discharge instructions and scripts given to pt. Questions answered, pt states understanding, signed and given copy

## 2018-02-06 NOTE — Discharge Summary (Signed)
Physician Discharge Summary  Patient ID: Kim Welch MRN: [id number] DOB/AGE: November 22, 1980 37 y.o.  Admit date: 02/04/2018 Discharge date: 02/06/2018   Discharge Diagnoses:  Symptomatic Uterine Fibroids, Menorrhagia, Dysmenorrhea and Anemia Active Problems:   Fibroids   Operation: Abdominal Myomectomy   Discharged Condition: Good   Hospital Course: On the date of admission the patient underwent the aforementioned procedure and tolerated it well.  Post operative course was unremarkable with the patient resuming bowel and bladder function by post operative day #2 and was therefore deemed ready for discharge home.  Discharge hemoglobin was 10.0.  Disposition: Discharge disposition: 01-Home or Self Care       Discharge Medications:  Allergies as of 02/06/2018      Reactions   Penicillins Nausea And Vomiting   Has patient had a PCN reaction causing immediate rash, facial/tongue/throat swelling, SOB or lightheadedness with hypotension: No Has patient had a PCN reaction causing severe rash involving mucus membranes or skin necrosis: No Has patient had a PCN reaction that required hospitalization: No Has patient had a PCN reaction occurring within the last 10 years: No If all of the above answers are "NO", then may proceed with Cephalosporin use.      Medication List    STOP taking these medications   acetaminophen 500 MG tablet Commonly known as:  TYLENOL   fluconazole 150 MG tablet Commonly known as:  DIFLUCAN   Fluoxetine HCl (PMDD) 20 MG Caps     TAKE these medications   clindamycin 1 % external solution Commonly known as:  CLEOCIN T Apply 1 application topically daily as needed for rash.   ferrous sulfate 325 (65 FE) MG EC tablet Take 650 mg by mouth daily.   HYDROmorphone 2 MG tablet Commonly known as:  DILAUDID Take 1 tablet (2 mg total) by mouth every 4 (four) hours as needed for severe pain.   ibuprofen 800 MG tablet Commonly known as:   ADVIL,MOTRIN Take 1 tablet (800 mg total) by mouth every 8 (eight) hours as needed.   losartan-hydrochlorothiazide 50-12.5 MG tablet Commonly known as:  HYZAAR Take 1 tablet by mouth daily.   ondansetron 4 MG tablet Commonly known as:  ZOFRAN Take 1 tablet (4 mg total) by mouth every 8 (eight) hours as needed for nausea or vomiting.   sertraline 50 MG tablet Commonly known as:  ZOLOFT Take 50 mg by mouth daily.   valACYclovir 500 MG tablet Commonly known as:  VALTREX Take 500 mg by mouth daily as needed (outbreaks).        Follow-up: Dr. Eli Hose on March 16, 2018 at 8:30 a.m.   Signed: Earnstine Regal, PA-C 02/06/2018, 7:14 AM

## 2018-03-20 ENCOUNTER — Institutional Professional Consult (permissible substitution): Payer: BC Managed Care – PPO | Admitting: Neurology

## 2018-05-08 ENCOUNTER — Encounter: Payer: Self-pay | Admitting: Neurology

## 2018-05-08 ENCOUNTER — Ambulatory Visit: Payer: BC Managed Care – PPO | Admitting: Neurology

## 2018-05-08 VITALS — BP 141/83 | HR 77 | Ht 63.5 in | Wt 208.0 lb

## 2018-05-08 DIAGNOSIS — R519 Headache, unspecified: Secondary | ICD-10-CM

## 2018-05-08 DIAGNOSIS — R51 Headache: Secondary | ICD-10-CM

## 2018-05-08 DIAGNOSIS — E669 Obesity, unspecified: Secondary | ICD-10-CM | POA: Diagnosis not present

## 2018-05-08 DIAGNOSIS — R0683 Snoring: Secondary | ICD-10-CM

## 2018-05-08 DIAGNOSIS — G478 Other sleep disorders: Secondary | ICD-10-CM | POA: Diagnosis not present

## 2018-05-08 DIAGNOSIS — J4520 Mild intermittent asthma, uncomplicated: Secondary | ICD-10-CM

## 2018-05-08 DIAGNOSIS — G479 Sleep disorder, unspecified: Secondary | ICD-10-CM

## 2018-05-08 NOTE — Patient Instructions (Signed)

## 2018-05-08 NOTE — Progress Notes (Signed)
Subjective:    Patient ID: JALISHA ENNEKING is a 37 y.o. female.  HPI     Star Age, MD, PhD [facility] [address], [address] P.O. [place], [place] [address]  Dear Benjamine Mola,  I saw your patient, Reve Crocket, upon your kind request in the sleep clinic for initial consultation of her sleep disorder, in particular, concern for underlying obstructive sleep apnea. The patient is unaccompanied today. As you know, Ms. Linares is a 37 year old right-handed woman with an underlying medical history of reflux disease, asthma, anxiety, fibroids, hypertension, history of heart murmur and obesity, who reports snoring and gasping sensations at night as well as waking up with a headache at times. I reviewed your office note from 01/10/2018, which you kindly included. Her Epworth sleepiness score is 4 out of 24 today, fatigue score is 35 out of 63. She lives with her fianc and her son, she works for [employer]. She smokes occasionally, drinks alcohol occasionally, 1 glass a week on average, caffeine in the form of coffee, one cup per day on average. 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 She has 1 small dog in her bedroom, typically on her bed. Bedtime is between 9:30 and 10 and rise time is at 5. She does not typically wake up rested, she is a restless sleeper. She has a family history of snoring but no family history of obstructive sleep apnea as far she knows. Her weight has been fluctuating a little bit but generally stays between 200 to 210 lb. She has some difficulty falling asleep at times and takes an over-the-counter p.m. type medication. She has been on low-dose sertraline for the past 5 or 6 years for anxiety. She has had morning headaches more so in the past. She does not have night to night nocturia.  Her Past Medical History Is Significant For: Past Medical History:  Diagnosis Date  . Abnormal Pap smear   . Anemia   . Anxiety    . Asthma    SEASONAL  . Dysmenorrhea   . Fibroids   . GERD (gastroesophageal reflux disease)   . Heart murmur   . 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 Hypertension   . Menorrhagia     Her Past Surgical History Is Significant For: Past Surgical History:  Procedure Laterality Date  . DILATATION & CURETTAGE/HYSTEROSCOPY WITH TRUECLEAR N/A 06/27/2013   Procedure: DILATATION & CURETTAGE/HYSTEROSCOPY WITH TRUECLEAR;  Surgeon: Betsy Coder, MD;  Location: [facility];  Service: Gynecology;  Laterality: N/A;  . MYOMECTOMY N/A 02/04/2018   Procedure: MULTIPLE ABDOMINAL MYOMECTOMY;  Surgeon: Ena Dawley, MD;  Location: [facility];  Service: Gynecology;  Laterality: N/A;  . NO PAST SURGERIES      Her Family History Is Significant For: Family History  Problem Relation Age of Onset  . Hypertension Mother   . Diabetes Mother   . Hypertension Father   . Hypertension Sister     Her Social History Is Significant For: Social History   Socioeconomic History  . Marital status: Single    Spouse name: Not on file  . Number of children: Not on file  . Years of education: Not on file  . Highest education level: Not on file  Occupational History  . Not on file  Social Needs  . Financial resource strain: Not on file  . Food insecurity:    Worry: Not on file    Inability: Not on file  .  Transportation needs:    Medical: Not on file    Non-medical: Not on file  Tobacco Use  . Smoking status: Former Smoker    Types: Cigarettes  . Smokeless tobacco: Never Used  . Tobacco comment: less than pack per month  Substance and Sexual Activity  . Alcohol use: Yes    Comment: social  . Drug use: No  . Sexual activity: Yes    Partners: Male    Birth control/protection: Pill, None    Comment: alyacen 1/35  Lifestyle  . Physical activity:    Days per week: Not on file    Minutes per session: Not on file  . Stress: Not on file  Relationships  . Social connections:    Talks on  phone: Not on file    Gets together: Not on file    Attends religious service: Not on file    Active member of club or organization: Not on file    Attends meetings of clubs or organizations: Not on file    Relationship status: Not on file  Other Topics Concern  . Not on file  Social History Narrative  . Not on file    Her Allergies Are:  Allergies  Allergen Reactions  . Penicillins Nausea And Vomiting    Has patient had a PCN reaction causing immediate rash, facial/tongue/throat swelling, SOB or lightheadedness with hypotension: No Has patient had a PCN reaction causing severe rash involving mucus membranes or skin necrosis: No Has patient had a PCN reaction that required hospitalization: No Has patient had a PCN reaction occurring within the last 10 years: No If all of the above answers are "NO", then may proceed with Cephalosporin use.   :   Her Current Medications Are:  Outpatient Encounter Medications as of 05/08/2018  Medication Sig  . clindamycin (CLEOCIN T) 1 % external solution Apply 1 application topically daily as needed for rash.  . ferrous sulfate 325 (65 FE) MG EC tablet Take 650 mg by mouth daily.  Marland Kitchen losartan-hydrochlorothiazide (HYZAAR) 50-12.5 MG tablet Take 1 tablet by mouth daily.   . ondansetron (ZOFRAN) 4 MG tablet Take 1 tablet (4 mg total) by mouth every 8 (eight) hours as needed for nausea or vomiting.  . sertraline (ZOLOFT) 50 MG tablet Take 50 mg by mouth daily.  . valACYclovir (VALTREX) 500 MG tablet Take 500 mg by mouth daily as needed (outbreaks).   . [DISCONTINUED] HYDROmorphone (DILAUDID) 2 MG tablet Take 1 tablet (2 mg total) by mouth every 4 (four) hours as needed for severe pain.  . [DISCONTINUED] ibuprofen (ADVIL,MOTRIN) 800 MG tablet Take 1 tablet (800 mg total) by mouth every 8 (eight) hours as needed.   No facility-administered encounter medications on file as of 05/08/2018.   :  Review of Systems:  Out of a complete 14 point review of  systems, all are reviewed and negative with the exception of these symptoms as listed below: Review of Systems  Neurological:       Pt presents today to discuss her sleep. Pt has never had a sleep study but does endorse loud snoring.  Epworth Sleepiness Scale 0= would never doze 1= slight chance of dozing 2= moderate chance of dozing 3= high chance of dozing  Sitting and reading: 0 Watching TV: 1 Sitting inactive in a public place (ex. Theater or meeting): 0 As a passenger in a car for an hour without a break: 0 Lying down to rest in the afternoon: 3 Sitting and talking to  someone: 0 Sitting quietly after lunch (no alcohol): 0 In a car, while stopped in traffic: 0 Total: 4     Objective:  Neurological Exam  Physical Exam Physical Examination:   Vitals:   05/08/18 1518  BP: (!) 141/83  Pulse: 77   General Examination: The patient is a very pleasant 37 y.o. female in no acute distress. She appears well-developed and well-nourished and well groomed.   HEENT: Normocephalic, atraumatic, pupils are equal, round and reactive to light and accommodation. Funduscopic exam is normal with sharp disc margins noted. Extraocular tracking is good without limitation to gaze excursion or nystagmus noted. Normal smooth pursuit is noted. Hearing is grossly intact. Tympanic membranes are clear bilaterally. Face is symmetric with normal facial animation and normal facial sensation. Speech is clear with no dysarthria noted. There is no hypophonia. There is no lip, neck/head, jaw or voice tremor. Neck is supple with full range of passive and active motion. There are no carotid bruits on auscultation. Oropharynx exam reveals: mild mouth dryness, adequate dental hygiene and moderate airway crowding, due to thicker uvula, longer uvula, tonsils are 1-2+, Mallampati is class II. Neck circumference is 13-3/4 inches. She has a mild overbite.  Chest: Clear to auscultation without wheezing, rhonchi or crackles  noted.  Heart: S1+S2+0, regular and normal without murmurs, rubs or gallops noted.   Abdomen: Soft, non-tender and non-distended with normal bowel sounds appreciated on auscultation.  Extremities: There is no pitting edema in the distal lower extremities bilaterally. Pedal pulses are intact.  Skin: Warm and dry without trophic changes noted.  Musculoskeletal: exam reveals no obvious joint deformities, tenderness or joint swelling or erythema.   Neurologically:  Mental status: The patient is awake, alert and oriented in all 4 spheres. Her immediate and remote memory, attention, language skills and fund of knowledge are appropriate. There is no evidence of aphasia, agnosia, apraxia or anomia. Speech is clear with normal prosody and enunciation. Thought process is linear. Mood is normal and affect is normal.  Cranial nerves II - XII are as described above under HEENT exam. In addition: shoulder shrug is normal with equal shoulder height noted. Motor exam: Normal bulk, strength and tone is noted. There is no drift, tremor or rebound. Romberg is negative. Reflexes are 2+ throughout. Fine motor skills and coordination: intact grossly.   Cerebellar testing: No dysmetria or intention tremor. There is no truncal or gait ataxia.  Sensory exam: intact to light touch in the upper and lower extremities.  Gait, station and balance: She stands easily. No veering to one side is noted. No leaning to one side is noted. Posture is age-appropriate and stance is narrow based. Gait shows normal stride length and pace. No problems turning are noted. Tandem walk is unremarkable.   Assessment and Plan:  In summary, Royetta T Eckstein is a very pleasant 37 y.o.-year old female with an underlying medical history of reflux disease, asthma, anxiety, fibroids, hypertension, history of heart murmur and obesity, whose history and physical exam are concerning for obstructive sleep apnea (OSA). I had a long chat with the patient  about my findings and the diagnosis of OSA, its prognosis and treatment options. We talked about medical treatments, surgical interventions and non-pharmacological approaches. I explained in particular the risks and ramifications of untreated moderate to severe OSA, especially with respect to developing cardiovascular disease down the Road, including congestive heart failure, difficult to treat hypertension, cardiac arrhythmias, or stroke. Even type 2 diabetes has, in part, been linked to  untreated OSA. Symptoms of untreated OSA include daytime sleepiness, memory problems, mood irritability and mood disorder such as depression and anxiety, lack of energy, as well as recurrent headaches, especially morning headaches. We talked about smoking cessation and trying to maintain a healthy lifestyle in general, as well as the importance of weight control. I encouraged the patient to eat healthy, exercise daily and keep well hydrated, to keep a scheduled bedtime and wake time routine, to not skip any meals and eat healthy snacks in between meals. I advised the patient not to drive when feeling sleepy. I recommended the following at this time: sleep study with potential positive airway pressure titration. (We will score hypopneas at 3%).   I explained the sleep test procedure to the patient and also outlined possible surgical and non-surgical treatment options of OSA, including the use of a custom-made dental device (which would require a referral to a specialist dentist or oral surgeon), upper airway surgical options, such as pillar implants, radiofrequency surgery, tongue base surgery, and UPPP (which would involve a referral to an ENT surgeon). Rarely, jaw surgery such as mandibular advancement may be considered.  I also explained the CPAP treatment option to the patient, who indicated that she would be willing to try CPAP if the need arises. I explained the importance of being compliant with PAP treatment, not only  for insurance purposes but primarily to improve Her symptoms, and for the patient's long term health benefit, including to reduce Her cardiovascular risks. I answered all her questions today and the patient was in agreement. I plan to see her back after the sleep study is completed and encouraged her to call with any interim questions, concerns, problems or updates.   Thank you very much for allowing me to participate in the care of this nice patient. If I can be of any further assistance to you please do not hesitate to call me at [phone].  Sincerely,   Star Age, MD, PhD

## 2019-05-19 ENCOUNTER — Other Ambulatory Visit: Payer: Self-pay | Admitting: *Deleted

## 2019-05-19 DIAGNOSIS — Z20822 Contact with and (suspected) exposure to covid-19: Secondary | ICD-10-CM

## 2019-05-19 NOTE — Addendum Note (Signed)
Addended by: Terence Lux on: 05/19/2019 03:43 PM   Modules accepted: Orders

## 2019-05-21 LAB — NOVEL CORONAVIRUS, NAA: SARS-CoV-2, NAA: DETECTED — AB

## 2019-07-21 ENCOUNTER — Encounter: Payer: Self-pay | Admitting: Emergency Medicine

## 2019-07-21 ENCOUNTER — Other Ambulatory Visit: Payer: Self-pay

## 2019-07-21 ENCOUNTER — Ambulatory Visit
Admission: EM | Admit: 2019-07-21 | Discharge: 2019-07-21 | Disposition: A | Discharge status: Home / Self Care | Payer: BC Managed Care – PPO

## 2019-07-21 DIAGNOSIS — J029 Acute pharyngitis, unspecified: Secondary | ICD-10-CM | POA: Diagnosis not present

## 2019-07-21 NOTE — ED Triage Notes (Signed)
Pt presents to [facility] for assessment of 6 days of sore throat, moving from side to side.  Patient c/o dryness to back of throat this morning.  Pt states she saw white spots in her throat last week, but everything seems to have improved at this point.

## 2019-07-21 NOTE — Discharge Instructions (Addendum)
Recommend daytime allergy medication, Flonase nasal spray to help with symptoms. May continue Tylenol, warm salt water gargles. Please return for worsening throat pain, fever, difficulty chewing/swallowing, breathing.

## 2019-07-21 NOTE — ED Provider Notes (Signed)
[facility]    CSN: [id number] Arrival date & time: 07/21/19  1412      History   Chief Complaint Chief Complaint  Patient presents with  . aPPT: 230pm  . Sore Throat    HPI Kim Welch is a 38 y.o. female with history of allergies presenting for sore throat x 6 days.  Patient states he has been doing warm salt water gargles, drinking more water with slow improvement.  Patient did have some exudate early on in her course, though have disappeared since.  Patient denies recent sick contacts, fever, cough, shortness of breath.    Past Medical History:  Diagnosis Date  . Abnormal Pap smear   . Anemia   . Anxiety   . Asthma    SEASONAL  . Dysmenorrhea   . Fibroids   . GERD (gastroesophageal reflux disease)   . Heart murmur   . 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 Hypertension   . Menorrhagia     Patient Active Problem List   Diagnosis Date Noted  . Generalized anxiety disorder 07/12/2016  . Fibroids 12/22/2011  . Anemia 12/22/2011  . Irregular bleeding 12/22/2011  . ASTHMA 02/25/2007    Past Surgical History:  Procedure Laterality Date  . DILATATION & CURETTAGE/HYSTEROSCOPY WITH TRUECLEAR N/A 06/27/2013   Procedure: DILATATION & CURETTAGE/HYSTEROSCOPY WITH TRUECLEAR;  Surgeon: Betsy Coder, MD;  Location: [facility];  Service: Gynecology;  Laterality: N/A;  . MYOMECTOMY N/A 02/04/2018   Procedure: MULTIPLE ABDOMINAL MYOMECTOMY;  Surgeon: Ena Dawley, MD;  Location: [facility];  Service: Gynecology;  Laterality: N/A;  . NO PAST SURGERIES      OB History    Gravida  2   Para  1   Term  1   Preterm      AB  1   Living  1     SAB      TAB  1   Ectopic      Multiple      Live Births               Home Medications    Prior to Admission medications   Medication Sig Start Date End Date Taking? Authorizing Provider  valACYclovir (VALTREX) 500 MG tablet Take 500 mg by mouth daily as needed (outbreaks).  01/08/18   Yes [provider]  clindamycin (CLEOCIN T) 1 % external solution Apply 1 application topically daily as needed for rash. 01/02/18   [provider]  ferrous sulfate 325 (65 FE) MG EC tablet Take 650 mg by mouth daily.    [provider]  losartan-hydrochlorothiazide (HYZAAR) 50-12.5 MG tablet Take 1 tablet by mouth daily.  06/18/16   [provider]  sertraline (ZOLOFT) 50 MG tablet Take 50 mg by mouth daily.    [provider]    Family History Family History  Problem Relation Age of Onset  . Hypertension Mother   . Diabetes Mother   . Hypertension Father   . Hypertension Sister     Social History Social History   Tobacco Use  . Smoking status: Former Smoker    Types: Cigarettes  . Smokeless tobacco: Never Used  . Tobacco comment: less than pack per month  Substance Use Topics  . Alcohol use: Yes    Comment: social  . Drug use: No     Allergies   Penicillins   Review of Systems Review of Systems  Constitutional: Negative for fatigue and fever.  HENT: Positive for sore throat. Negative for congestion, dental problem, ear pain, facial swelling, hearing loss, sinus pain, trouble swallowing and voice change.   Eyes: Negative for photophobia, pain and visual disturbance.  Respiratory: Negative for cough and shortness of breath.   Cardiovascular: Negative for chest pain and palpitations.  Gastrointestinal: Negative for diarrhea and vomiting.  Musculoskeletal: Negative for arthralgias and myalgias.  Neurological: Negative for dizziness and headaches.     Physical Exam Triage Vital Signs ED Triage Vitals  Enc Vitals Group     BP      Pulse      Resp      Temp      Temp src      SpO2      Weight      Height      Head Circumference      Peak Flow      Pain Score      Pain Loc      Pain Edu?      Excl. in [place]?    No data found.  Updated Vital Signs BP (!) 155/105 (BP Location: Right Arm)   Pulse 72   Temp (!)  97.5 F (36.4 C) (Temporal)   Resp 18   LMP 07/15/2019   SpO2 97%   Visual Acuity Right Eye Distance:   Left Eye Distance:   Bilateral Distance:    Right Eye Near:   Left Eye Near:    Bilateral Near:     Physical Exam Constitutional:      General: She is not in acute distress.    Appearance: She is obese. She is not ill-appearing.  HENT:     Head: Normocephalic and atraumatic.     Jaw: There is normal jaw occlusion. No tenderness or pain on movement.     Right Ear: Hearing, tympanic membrane, ear canal and external ear normal. No tenderness. No mastoid tenderness.     Left Ear: Hearing, tympanic membrane, ear canal and external ear normal. No tenderness. No mastoid tenderness.     Nose: No nasal deformity, septal deviation or nasal tenderness.     Right Turbinates: Not swollen or pale.     Left Turbinates: Not swollen or pale.     Right Sinus: No maxillary sinus tenderness or frontal sinus tenderness.     Left Sinus: No maxillary sinus tenderness or frontal sinus tenderness.     Mouth/Throat:     Lips: Pink. No lesions.     Mouth: Mucous membranes are moist. No injury.     Pharynx: Oropharynx is clear. Uvula midline. No posterior oropharyngeal erythema or uvula swelling.     Tonsils: No tonsillar exudate. 2+ on the right. 2+ on the left.  Eyes:     Conjunctiva/sclera: Conjunctivae normal.     Pupils: Pupils are equal, round, and reactive to light.  Neck:     Musculoskeletal: Normal range of motion and neck supple. No muscular tenderness.  Cardiovascular:     Rate and Rhythm: Normal rate.  Pulmonary:     Effort: Pulmonary effort is normal.  Lymphadenopathy:     Cervical: No cervical adenopathy.  Skin:    General: Skin is warm.     Capillary Refill: Capillary refill takes less than 2 seconds.     Coloration: Skin is not pale.     Findings: No erythema or rash.  Neurological:     Mental Status: She is alert and oriented to person, place, and time.  UC  Treatments / Results  Labs (all labs ordered are listed, but only abnormal results are displayed) Labs Reviewed - No data to display  EKG   Radiology No results found.  Procedures Procedures (including critical care time)  Medications Ordered in UC Medications - No data to display  Initial Impression / Assessment and Plan / UC Course  I have reviewed the triage vital signs and the nursing notes.  Pertinent labs & imaging results that were available during my care of the patient were reviewed by me and considered in my medical decision making (see chart for details).     H&P consistent with viral pharyngitis.  Patient continued to have improvement: We will continue symptomatic management.  Add allergy symptomatic management as outlined below.  Return precautions discussed, patient verbalized understanding and is agreeable to plan. Final Clinical Impressions(s) / UC Diagnoses   Final diagnoses:  Viral pharyngitis     Discharge Instructions     Recommend daytime allergy medication, Flonase nasal spray to help with symptoms. May continue Tylenol, warm salt water gargles. Please return for worsening throat pain, fever, difficulty chewing/swallowing, breathing.    ED Prescriptions    None     PDMP not reviewed this encounter.   Hall[place]Potvin, [place], [place] 07/21/19 1523

## 2020-12-06 ENCOUNTER — Other Ambulatory Visit: Payer: Self-pay | Admitting: Obstetrics and Gynecology

## 2021-04-06 ENCOUNTER — Other Ambulatory Visit: Payer: Self-pay | Admitting: Gastroenterology

## 2021-04-06 ENCOUNTER — Ambulatory Visit: Payer: Self-pay | Admitting: Nurse Practitioner

## 2021-04-06 DIAGNOSIS — R1011 Right upper quadrant pain: Secondary | ICD-10-CM

## 2021-04-11 ENCOUNTER — Other Ambulatory Visit: Payer: BC Managed Care – PPO

## 2021-08-09 ENCOUNTER — Other Ambulatory Visit: Payer: Self-pay

## 2021-08-09 ENCOUNTER — Emergency Department (HOSPITAL_BASED_OUTPATIENT_CLINIC_OR_DEPARTMENT_OTHER)
Admission: EM | Admit: 2021-08-09 | Discharge: 2021-08-09 | Disposition: A | Discharge status: Home / Self Care | Payer: BC Managed Care – PPO | Attending: Emergency Medicine | Admitting: Emergency Medicine

## 2021-08-09 ENCOUNTER — Encounter (HOSPITAL_BASED_OUTPATIENT_CLINIC_OR_DEPARTMENT_OTHER): Payer: Self-pay

## 2021-08-09 DIAGNOSIS — U071 COVID-19: Secondary | ICD-10-CM | POA: Insufficient documentation

## 2021-08-09 DIAGNOSIS — M7918 Myalgia, other site: Secondary | ICD-10-CM | POA: Diagnosis present

## 2021-08-09 DIAGNOSIS — Z87891 Personal history of nicotine dependence: Secondary | ICD-10-CM | POA: Insufficient documentation

## 2021-08-09 DIAGNOSIS — Z79899 Other long term (current) drug therapy: Secondary | ICD-10-CM | POA: Diagnosis not present

## 2021-08-09 DIAGNOSIS — J45909 Unspecified asthma, uncomplicated: Secondary | ICD-10-CM | POA: Diagnosis not present

## 2021-08-09 DIAGNOSIS — R000 Tachycardia, unspecified: Secondary | ICD-10-CM | POA: Insufficient documentation

## 2021-08-09 DIAGNOSIS — I10 Essential (primary) hypertension: Secondary | ICD-10-CM | POA: Diagnosis not present

## 2021-08-09 LAB — RESP PANEL BY RT-PCR (FLU A&B, COVID) ARPGX2
Influenza A by PCR: NEGATIVE
Influenza B by PCR: NEGATIVE
SARS Coronavirus 2 by RT PCR: POSITIVE — AB

## 2021-08-09 NOTE — ED Notes (Signed)
E-SIG PAD NOT WORKING IN TRIAGE-PT VERBALLY AGREED TO MSE STATEMENT

## 2021-08-09 NOTE — ED Triage Notes (Signed)
Pt c/o body aches, URI sx started yesterday-NAD-steady gait

## 2021-08-09 NOTE — Discharge Instructions (Addendum)
Please use Tylenol or ibuprofen for body aches.  You may use 600 mg ibuprofen every 6 hours or 1000 mg of Tylenol every 6 hours.  You may choose to alternate between the 2.  This would be most effective.  Not to exceed 4 g of Tylenol within 24 hours.  Not to exceed 3200 mg ibuprofen 24 hours.  The Tylenol will also help if you have any chills or fever.  Additionally I recommend rest, quarantine for the next 5 days, as well as plenty of fluids.  In addition to quarantine for the next 5 days current CDC guidelines recommend strict masking for 5 days afterwards.  Please inform any close contacts that you had been recently diagnosed with COVID so that they can get tested if they begin to have symptoms.  Please return if you have significant worsening shortness of breath, or chest pain.

## 2021-08-09 NOTE — ED Provider Notes (Signed)
[facility] Provider Note   CSN: [id number] Arrival date & time: 08/09/21  1429     History Chief Complaint  Patient presents with   Generalized Body Aches    Day T Kim Welch is a 40 y.o. female with a past medical history significant for hypertension, seasonal asthma who presents with fatigue, body aches, chills, sore throat since yesterday.  Patient reports that she was feeling a little bit unwell, took a nap, and woke up at 5 PM yesterday with severe body aches, chills.  Patient reports that she has not had any significant recent sick contacts but she has been out doing a lot of shopping.  Patient reports that she does not have difficulty swallowing or breathing with her sore throat.  Patient denies shortness of breath, chest pain.  Patient denies nausea, vomiting, diarrhea.  Patient has not received COVID, flu vaccine.  Patient does notably have history of COVID infection in July of this year.  HPI     Past Medical History:  Diagnosis Date   Abnormal Pap smear    Anemia    Anxiety    Asthma    SEASONAL   Dysmenorrhea    Fibroids    GERD (gastroesophageal reflux disease)    Heart murmur    Herpes simplex without mention of complication    Hypertension    Menorrhagia     Patient Active Problem List   Diagnosis Date Noted   Generalized anxiety disorder 07/12/2016   Fibroids 12/22/2011   Anemia 12/22/2011   Irregular bleeding 12/22/2011   ASTHMA 02/25/2007    Past Surgical History:  Procedure Laterality Date   DILATATION & CURETTAGE/HYSTEROSCOPY WITH TRUECLEAR N/A 06/27/2013   Procedure: DILATATION & CURETTAGE/HYSTEROSCOPY WITH TRUECLEAR;  Surgeon: Betsy Coder, MD;  Location: [facility];  Service: Gynecology;  Laterality: N/A;   MYOMECTOMY N/A 02/04/2018   Procedure: MULTIPLE ABDOMINAL MYOMECTOMY;  Surgeon: Ena Dawley, MD;  Location: [facility];  Service: Gynecology;  Laterality: N/A;   MYOMECTOMY     NO PAST SURGERIES       OB  History     Gravida  2   Para  1   Term  1   Preterm      AB  1   Living  1      SAB      IAB  1   Ectopic      Multiple      Live Births              Family History  Problem Relation Age of Onset   Hypertension Mother    Diabetes Mother    Hypertension Father    Hypertension Sister     Social History   Tobacco Use   Smoking status: Former    Types: Cigarettes   Smokeless tobacco: Never   Tobacco comments:    less than pack per month  Vaping Use   Vaping Use: Former  Substance Use Topics   Alcohol use: Yes    Comment: occ   Drug use: No    Home Medications Prior to Admission medications   Medication Sig Start Date End Date Taking? Authorizing Provider  ferrous sulfate 325 (65 FE) MG EC tablet Take 650 mg by mouth daily.    [provider]  losartan-hydrochlorothiazide (HYZAAR) 50-12.5 MG tablet Take 1 tablet by mouth daily.  06/18/16   [provider]  pantoprazole (PROTONIX) 40 MG tablet Take 40 mg by mouth daily. 08/02/21  [provider]  sertraline (ZOLOFT) 50 MG tablet Take 50 mg by mouth daily.    [provider]  valACYclovir (VALTREX) 500 MG tablet Take 500 mg by mouth daily as needed (outbreaks).  01/08/18   [provider]    Allergies    Penicillins  Review of Systems   Review of Systems  HENT:  Positive for sore throat.   Musculoskeletal:  Positive for myalgias.  All other systems reviewed and are negative.  Physical Exam Updated Vital Signs BP (!) 145/106 (BP Location: Right Arm)    Pulse (!) 102    Temp 98.7 F (37.1 C) (Oral)    Resp 18    Ht 5\' 3"  (1.6 m)    Wt 86.3 kg    LMP 07/18/2021    SpO2 100%    BMI 33.69 kg/m   Physical Exam Vitals and nursing note reviewed.  Constitutional:      General: She is not in acute distress.    Appearance: Normal appearance.  HENT:     Head: Normocephalic and atraumatic.     Comments: Some erythema without swelling of tonsils,  peritonsillar abscess, or uvular deviation and posterior oropharynx Eyes:     General:        Right eye: No discharge.        Left eye: No discharge.  Cardiovascular:     Rate and Rhythm: Regular rhythm. Tachycardia present.     Comments: Intermittent tachycardia Pulmonary:     Effort: Pulmonary effort is normal. No respiratory distress.  Musculoskeletal:        General: No deformity.  Skin:    General: Skin is warm and dry.  Neurological:     Mental Status: She is alert and oriented to person, place, and time.  Psychiatric:        Mood and Affect: Mood normal.        Behavior: Behavior normal.    ED Results / Procedures / Treatments   Labs (all labs ordered are listed, but only abnormal results are displayed) Labs Reviewed  RESP PANEL BY RT-PCR (FLU A&B, COVID) [id number] - Abnormal; Notable for the following components:      Result Value   SARS Coronavirus 2 by RT PCR POSITIVE (*)    All other components within normal limits    EKG None  Radiology No results found.  Procedures Procedures   Medications Ordered in ED Medications - No data to display  ED Course  I have reviewed the triage vital signs and the nursing notes.  Pertinent labs & imaging results that were available during my care of the patient were reviewed by me and considered in my medical decision making (see chart for details).    MDM Rules/Calculators/A&P                         Well-appearing patient with 1 day of chills, body aches, sore throat who presents for evaluation.  Patient with intermittent tachycardia on evaluation.  Patient with normal heart and lung sounds, no evidence of wheezing, or respiratory distress.  Respiratory virus panel is positive for COVID.  Patient with medical history significant for hypertension, and no other significant risk factors.  Based on patient clinical appearance, lack of risk factors discussed with patient I do not recommend paxlovid at this time.  Discussed  symptomatic care with ibuprofen, Tylenol for body aches, plenty of fluids, rest, quarantine for 5 days, discussed return precautions including worsening  shortness of breath, chest pain.  Discussed recommendation for reevaluation at primary care or urgent care if symptoms persist for longer than 10 days.  Patient discharged in stable condition at this time, return precautions given. Final Clinical Impression(s) / ED Diagnoses Final diagnoses:  [id number]    Rx / DC Orders ED Discharge Orders     None        Anselmo Pickler, PA-C 08/09/21 [place], [place], DO 08/10/21 [phone]

## 2021-10-13 ENCOUNTER — Other Ambulatory Visit: Payer: Self-pay | Admitting: Obstetrics and Gynecology

## 2021-10-13 DIAGNOSIS — E049 Nontoxic goiter, unspecified: Secondary | ICD-10-CM

## 2021-10-21 ENCOUNTER — Other Ambulatory Visit: Payer: BC Managed Care – PPO

## 2021-10-26 ENCOUNTER — Ambulatory Visit
Admission: RE | Admit: 2021-10-26 | Discharge: 2021-10-26 | Disposition: A | Discharge status: Home / Self Care | Payer: BC Managed Care – PPO | Source: Ambulatory Visit | Attending: Obstetrics and Gynecology | Admitting: Obstetrics and Gynecology

## 2021-10-26 DIAGNOSIS — E049 Nontoxic goiter, unspecified: Secondary | ICD-10-CM

## 2021-11-02 ENCOUNTER — Other Ambulatory Visit: Payer: Self-pay | Admitting: Obstetrics and Gynecology

## 2023-02-01 ENCOUNTER — Other Ambulatory Visit: Payer: Self-pay | Admitting: Obstetrics and Gynecology

## 2023-07-20 IMAGING — US US THYROID
1 series · 13 of 25 positions shown · non-contrast
Comparison: Chest XR, 10/20/2021.

CLINICAL DATA: Palpable abnormality. Goiter on physical examination

EXAM:
THYROID ULTRASOUND
TECHNIQUE: Ultrasound examination of the thyroid gland and adjacent soft
tissues was performed.

[Series 1: us thyroid · 0.07mm/px · 13 of 50 slices shown]
[im 1/50]
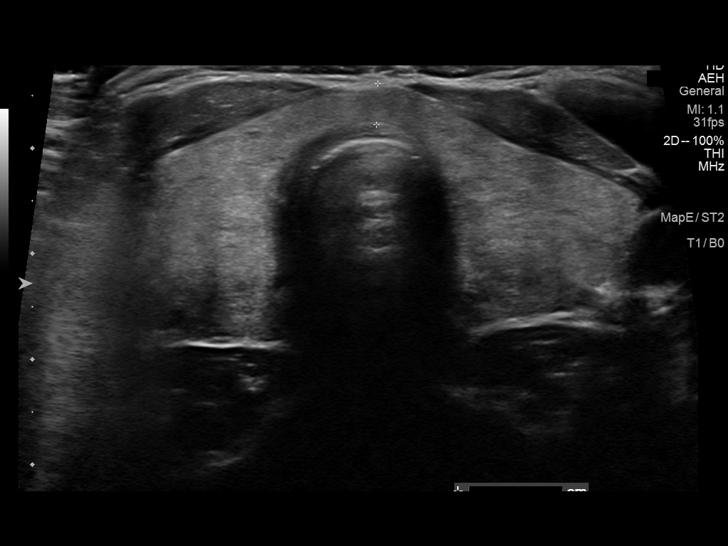
[im 5/50]
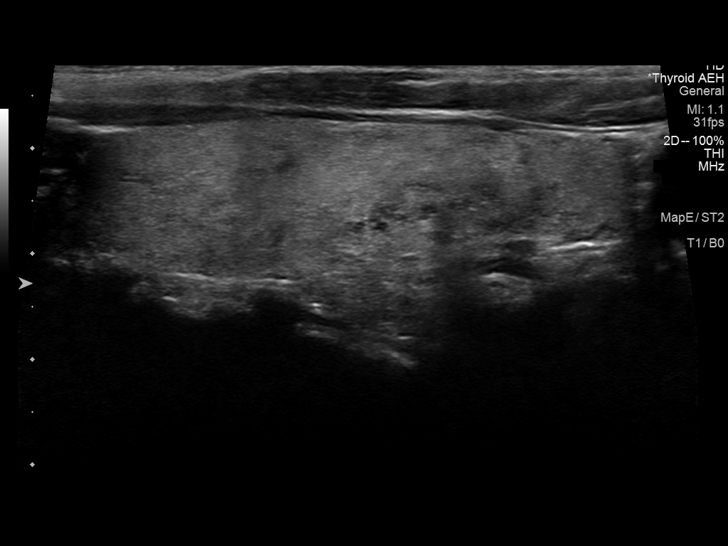
[im 9/50]
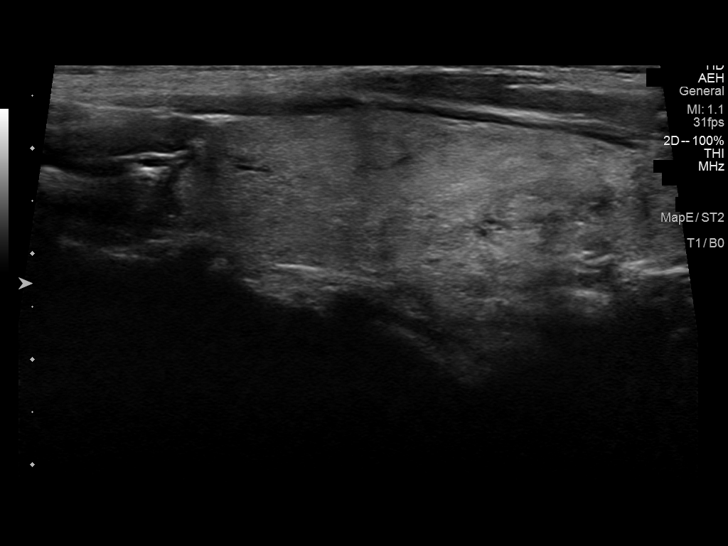
[im 13/50]
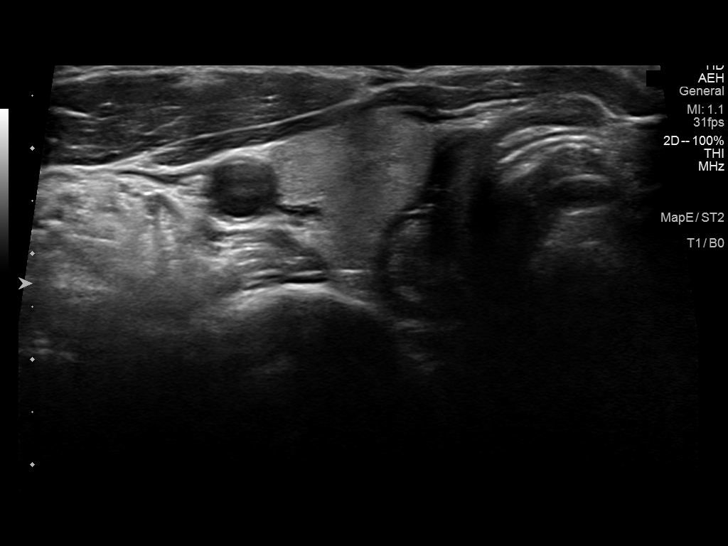
[im 17/50]
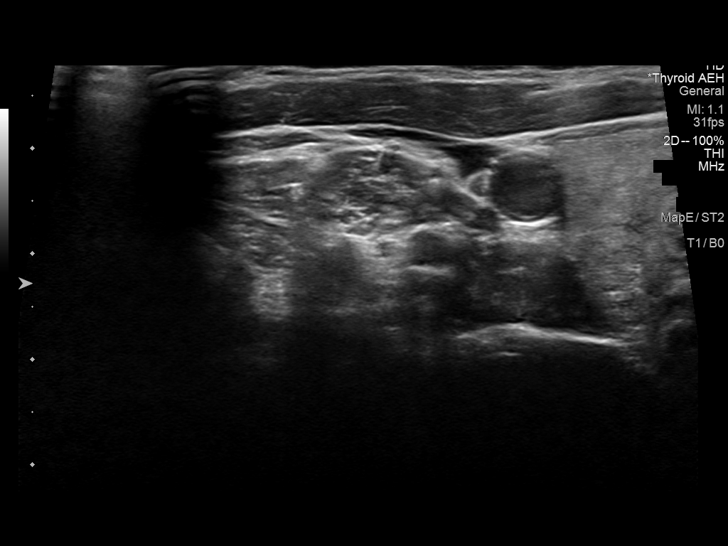
[im 21/50]
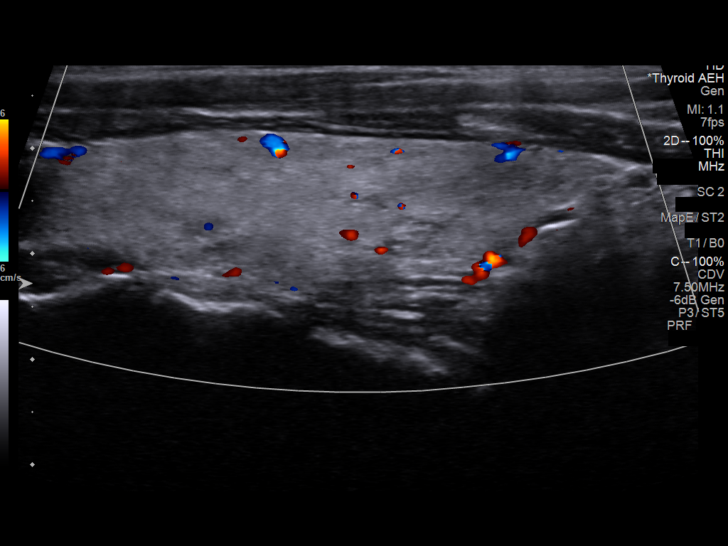
[im 25/50]
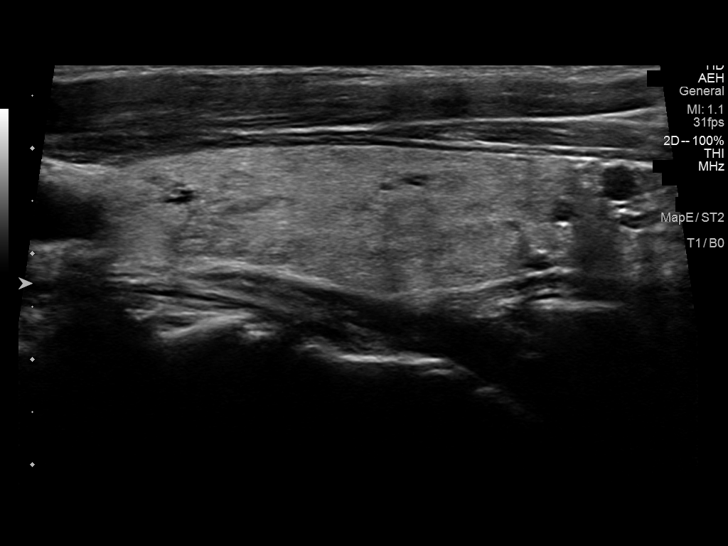
[im 29/50]
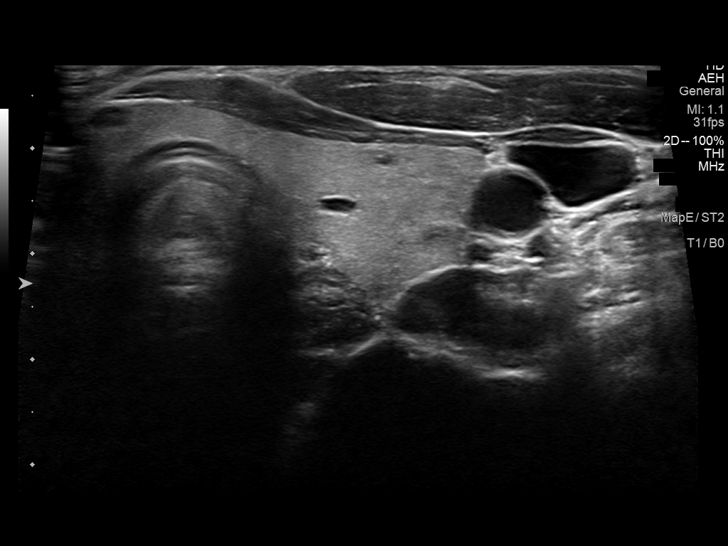
[im 33/50]
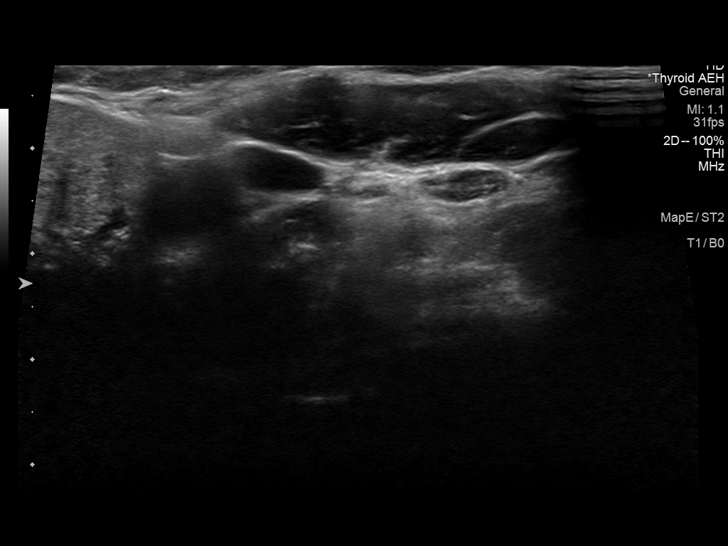
[im 37/50]
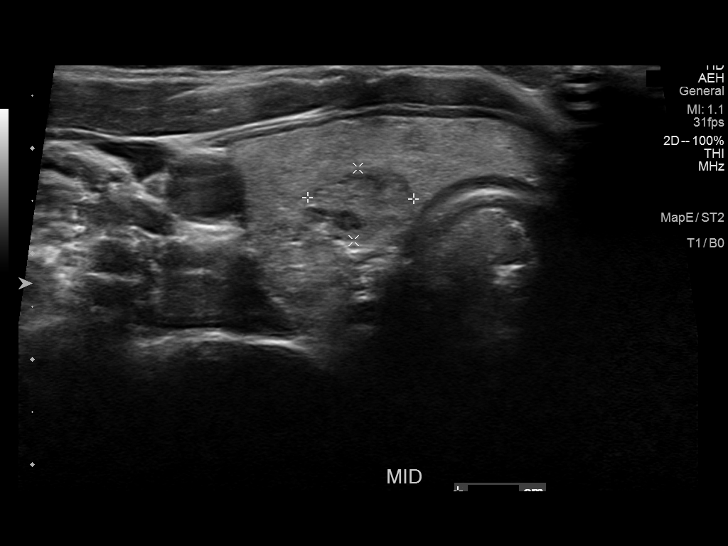
[im 41/50]
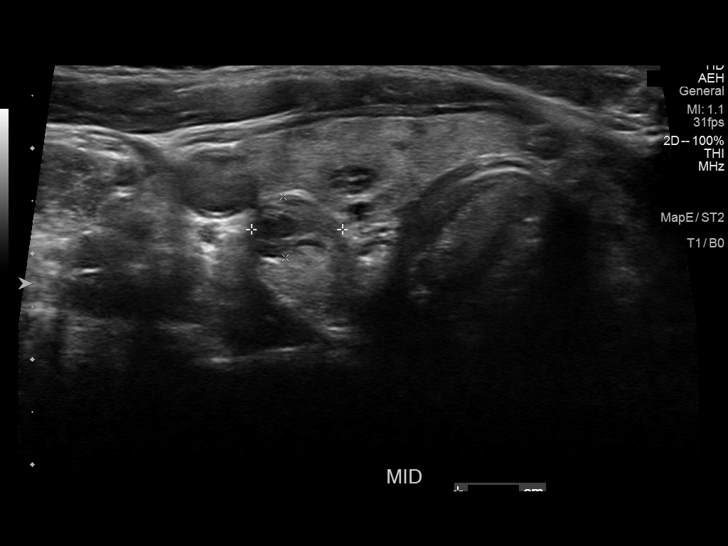
[im 45/50]
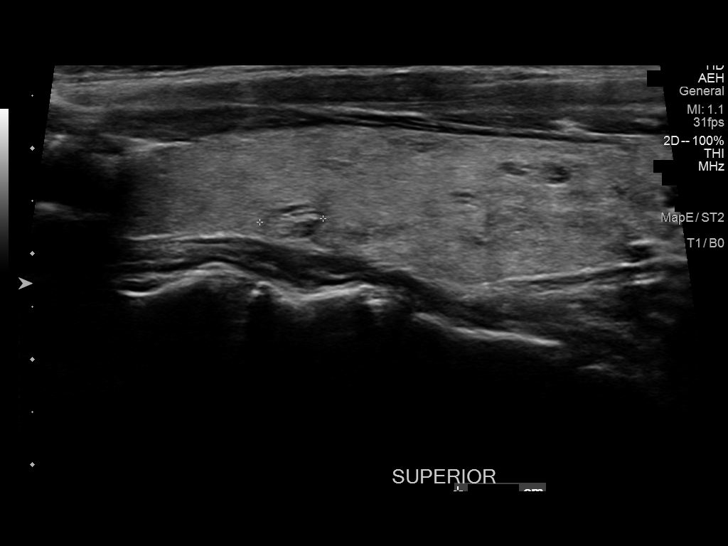
[im 50/50]
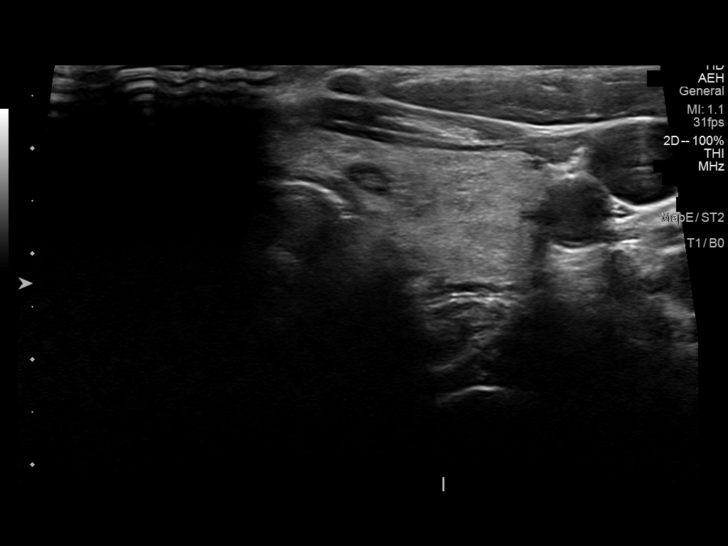

[13 of 25 positions shown; findings below may reference images not displayed]

FINDINGS: Parenchymal Echotexture: Mildly heterogenous

Isthmus: 0.4 cm

Right lobe: 5.4 x 1.8 x 2.4 cm

Left lobe: 5.4 x 1.5 x 1.8 cm

_________________________________________________________

Estimated total number of nodules >/= 1 cm: 2

Number of spongiform nodules >/=  2 cm not described below (TR1): 0

Number of mixed cystic and solid nodules >/= 1.5 cm not described
below (TR2): 0

_________________________________________________________

Nodule # 1:

Location: RIGHT; Mid

Maximum size: 1.4 cm; Other 2 dimensions: 1.0 x 0.7 cm

Composition: solid/almost completely solid (2)

Echogenicity: isoechoic (1)

Shape: not taller-than-wide (0)

Margins: ill-defined (0)

Echogenic foci: none (0)

ACR TI-RADS total points: 3.

ACR TI-RADS risk category: TR3 (3 points).

ACR TI-RADS recommendations:

Given size (<1.4 cm) and appearance, this nodule does NOT meet
TI-RADS criteria for biopsy or dedicated follow-up.

_________________________________________________________

Punctate colloid cysts present and scattered within the gland.

Additional 1 cm and sub-5 cm nodules including;

#2 1.0 cm RIGHT mid TR-2 and #3 0.6 cm LEFT superior TR-3 nodules do
not meet threshold for follow-up nor biopsy per current criteria.

No cervical adenopathy or abnormal fluid collection within the
imaged neck.
IMPRESSION: Multinodular thyroid gland, as above.

No nodule meets threshold for follow-up nor biopsy per current
criteria

The above is in keeping with the ACR TI-RADS recommendations - [HOSPITAL] 6352;[DATE].
